# Patient Record
Sex: Male | Born: 1960 | Race: White | Hispanic: No | Marital: Married | Smoking: Current every day smoker
Health system: Southern US, Community
[De-identification: ages and names within clinical notes are randomized; demographics above are authoritative.]

## PROBLEM LIST (undated history)

## (undated) DIAGNOSIS — E1165 Type 2 diabetes mellitus with hyperglycemia: Secondary | ICD-10-CM

## (undated) DIAGNOSIS — E785 Hyperlipidemia, unspecified: Secondary | ICD-10-CM

## (undated) DIAGNOSIS — J45909 Unspecified asthma, uncomplicated: Secondary | ICD-10-CM

## (undated) DIAGNOSIS — T7840XA Allergy, unspecified, initial encounter: Secondary | ICD-10-CM

## (undated) DIAGNOSIS — R7303 Prediabetes: Secondary | ICD-10-CM

## (undated) HISTORY — DX: Unspecified asthma, uncomplicated: J45.909

## (undated) HISTORY — DX: Hyperlipidemia, unspecified: E78.5

## (undated) HISTORY — PX: NO PAST SURGERIES: SHX2092

## (undated) HISTORY — DX: Prediabetes: R73.03

## (undated) HISTORY — DX: Type 2 diabetes mellitus with hyperglycemia: E11.65

## (undated) HISTORY — DX: Allergy, unspecified, initial encounter: T78.40XA

## (undated) HISTORY — PX: OTHER SURGICAL HISTORY: SHX169

---

## 2008-09-14 DIAGNOSIS — J309 Allergic rhinitis, unspecified: Secondary | ICD-10-CM | POA: Insufficient documentation

## 2009-04-02 DIAGNOSIS — H60399 Other infective otitis externa, unspecified ear: Secondary | ICD-10-CM | POA: Insufficient documentation

## 2009-04-02 DIAGNOSIS — H6690 Otitis media, unspecified, unspecified ear: Secondary | ICD-10-CM | POA: Insufficient documentation

## 2011-06-23 ENCOUNTER — Telehealth: Payer: Self-pay | Admitting: Primary Care

## 2011-06-23 NOTE — Telephone Encounter (Signed)
Patient will be in tomorrow at 8 AM.

## 2011-06-23 NOTE — Telephone Encounter (Signed)
A week ago the pt slipped down his cellar stairs and hurt his L knee. He wanted to come in tomorrow to be seen by you, we dont have any PAs available and there are no openings on your schedule. Please advise

## 2011-06-23 NOTE — Telephone Encounter (Signed)
 I can see him at 8:00 am.

## 2011-06-24 ENCOUNTER — Ambulatory Visit: Payer: Self-pay | Admitting: Primary Care

## 2011-06-24 ENCOUNTER — Encounter: Payer: Self-pay | Admitting: Primary Care

## 2011-06-24 VITALS — BP 142/80 | HR 72 | Ht 70.87 in | Wt 255.0 lb

## 2011-06-24 DIAGNOSIS — M25562 Pain in left knee: Secondary | ICD-10-CM

## 2011-06-24 NOTE — Progress Notes (Signed)
PATIENT ID:  Luis Reyes is a 50 y.o. male who is here for left knee pain.    HPI:  One week ago he was carrying a basket of laundry up the stairs when he slipped and landed on a step on his anterior knee.  He experienced immediate pain which has not subsided.  Pain is diffuse throughout the knee and radiates to the distal thigh.  There was some swelling initially which seems to have gone down.  He has pain with weightbearing.  He has not experienced locking.  He has not injured this knee in the past.  Aleve it is somewhat helpful with the pain but does not completely relieve it.  Pain level 7/10.    Medications reviewed and no changes were made.    Current Outpatient Prescriptions   Medication Sig Dispense Refill   . fluticasone (FLONASE) 50 MCG/ACT nasal spray USE 2 SPRAYS IN EACH NOSTRIL ONCE DAILY  1  5     Allergies were reviewed and confirmed with the patient.    Allergies   Allergen Reactions   . No Known Drug Allergy        OBJECTIVE:    Filed Vitals:    06/24/11 0810   BP: 142/80   Pulse: 72   Height: 1.8 m (5' 10.87")   Weight: 115.667 kg (255 lb)    Body mass index is 35.70 kg/(m^2).    Physical Exam   Constitutional: Distressed: having trouble walking.   HENT:   Head: Normocephalic and atraumatic.   Eyes: Conjunctivae are normal. No scleral icterus.   Musculoskeletal:        Left knee: He exhibits decreased range of motion. He exhibits no ecchymosis, no deformity, no erythema and normal alignment. Tenderness: diffuse.   Neurological: He is alert. No cranial nerve deficit. Coordination normal. Abnormal gait: antalgic.   Psychiatric: Affect and judgment normal.     ASSESSMENT/PLAN:  1 week of left knee pain following impact injury.  I am concerned about and internal derangement, and fracture is also a possibility.  Will refer to orthopedics for further evaluation.    Follow-up with me as needed for this problem.  ]

## 2011-06-24 NOTE — Patient Instructions (Signed)
[facility]  [facility]  [address]  [place], [place], [address]    [phone]

## 2011-10-20 ENCOUNTER — Ambulatory Visit: Payer: Self-pay | Admitting: Primary Care

## 2011-10-20 ENCOUNTER — Encounter: Payer: Self-pay | Admitting: Gastroenterology

## 2011-10-20 ENCOUNTER — Encounter: Payer: Self-pay | Admitting: Primary Care

## 2011-10-20 VITALS — BP 118/78 | HR 78 | Ht 70.0 in | Wt 265.8 lb

## 2011-10-20 DIAGNOSIS — R079 Chest pain, unspecified: Secondary | ICD-10-CM

## 2011-10-20 DIAGNOSIS — F17200 Nicotine dependence, unspecified, uncomplicated: Secondary | ICD-10-CM

## 2011-10-20 MED ORDER — VARENICLINE TARTRATE 0.5 MG X 11 & 1 MG X 42 PO MISC *A*
ORAL | Status: DC
Start: 2011-10-20 — End: 2012-07-24

## 2011-10-20 NOTE — Patient Instructions (Addendum)
Ibuprofen.  Two to three 200 mg tablets three times a day, with food.  You can take up to 12 tablets/day, or 2,400 mg/day.  Stop if you develop stomach pain.  Do not take for more than 2 weeks.  Smoking Cessation     This document explains the best ways for you to quit smoking as well as new treatments to help. It lists new medications that can double or triple your chances of quitting and quitting for good. It also tells about ways to avoid relapses and talks about concerns you may have about quitting, including weight gain.      NICOTINE: A POWERFUL ADDICTION  If you have tried to quit smoking, you know how hard it can be. It is hard because nicotine is a very addictive drug. For some people, it can be as addictive as heroin or cocaine. Quitting is hard. Usually people make 2 or 3 tries, or more, before finally being able to quit. Each time you try to quit, you can learn about what helps and what hurts. Quitting takes hard work and a lot of effort, but you can quit smoking.     QUITTING SMOKING IS ONE OF THE MOST IMPORTANT THINGS YOU WILL EVER DO:   You will live longer and live better.   Quitting will lower your chance of having a heart attack, stroke, or cancer.   If you are pregnant, quitting smoking will improve your chances of having a healthy baby.   The people you live with, especially your children, will be healthier.   You will have extra money to spend on things other than cigarettes.     FIVE KEYS FOR QUITTING  Studies have shown that these five steps will help you quit and quit for good. You have the best chances of quitting if you use them together:   1 .Get ready.  2 .Get support.  3 .Learn new skills and behaviors.  4 .Get medication and use it correctly.  5 .Be prepared for relapse or difficult situations.     1. GET READY   Set a quit date.   Change your environment.  l Get rid of ALL cigarettes and ashtrays in your home, car, and place of work.  l Do not let people smoke in your home.    Review your past attempts to quit. Think about what worked and what did not.   Once you quit, do not smoke, NOT EVEN A PUFF!     2. GET SUPPORT AND ENCOURAGEMENT  Studies have shown that you have a better chance of being successful if you have help. You can get support in many ways:   Tell your family, friends, and coworkers that you are going to quit and need their support. Ask them not to smoke around you.   Talk to your health care provider (for example, doctor, dentist, nurse, pharmacist, psychologist, or smoking counselor).   Get individual, group or telephone counseling. The more counseling you have, the better your chances are of quitting. Programs are available at [employer] and health centers. Call your local health department for information about programs in your area.     3. LEARN NEW SKILLS AND BEHAVIORS   Try to distract yourself from urges to smoke. Talk to someone, go for a walk, or occupy your time with a task.   When you first try to quit, change your routine; use a different route to work. Drink tea instead of coffee. Eat breakfast in  a different place.   Do something to reduce your stress. Take a hot bath, exercise or read a book.   Plan something enjoyable to do every day.   Drink a lot of water and other fluids.     4. GET MEDICATION AND USE IT CORRECTLY  Medications can help you stop smoking and lessen the urge to smoke.    The U.S. Food and Drug Administration (FDA) has approved five medications to help you quit smoking:  l Bupropion SR - Available by prescription.  l Nicotine gum - Available over-the-counter.  l Nicotine inhaler - Available by prescription.  l Nicotine nasal spray - Available by prescription.  l Nicotine patch - Available by prescription and over-the-counter.   Ask your health care provider for advice and carefully read the information on the package.   All of these medications will more or less double your chances of quitting and quitting for good.    Everyone who is trying to quit may benefit from using a medication. If you are pregnant or trying to become pregnant, nursing, under age 62, smoking fewer than 10 cigarettes per day, or have a medical condition, talk to your doctor or other health care provider before taking medications.     5. BE PREPARED FOR RELAPSE OR DIFFICULT SITUATIONS  Most relapses occur within the first 3 months after quitting. Do not be discouraged if you start smoking again. Remember, most people try several times before they finally quit. Here are some difficult situations to watch for:    Alcohol. Avoid drinking alcohol. Drinking lowers your chances of success.    Other smokers. Being around smoking can make you want to smoke.    Weight gain. Many smokers will gain weight when they quit, usually less than 10 pounds. Eat a healthy diet and stay active. Do not let weight gain distract you from your main goal, quitting smoking. Some quit-smoking medications may help delay weight gain.    Bad mood or depression. There are a lot of ways to improve your mood other than smoking.  If you are having problems with any of these situations, talk to your doctor or other health care provider.     SPECIAL SITUATIONS OR CONDITIONS  Studies suggest that everyone can quit smoking. Your situation or condition can give you a special reason to quit.   Pregnant women/new mothers: By quitting, you protect your baby's health and your own.   Hospitalized patients: By quitting, you reduce health problems and help healing.   Heart attack patients: By quitting, you reduce your risk of a second heart attack.   Lung, head, and neck cancer patients: By quitting, you reduce your chance of a second cancer.    Parents of children and adolescents: By quitting, you protect your children and adolescents from illnesses caused by second-hand smoke.     QUESTIONS TO THINK ABOUT  Think about the following questions before you try to stop smoking. You may want to talk  about your answers with your health care provider.     1. Why do you want to quit?      2. If you tried to quit in the past, what helped and what did not?      3. What will be the most difficult situations for you after you quit? How will you plan to handle them?     4. Who can help you through the tough times? Your family? Friends? Health care provider?  5. What pleasures do you get from smoking? What ways can you still get pleasure if you quit?     Here are some questions to ask your health care provider.     1. How can you help me to be successful at quitting?      2. What medication do you think would be best for me and how should I take it?     3. What should I do if I need more help?      4. What is smoking withdrawal like? How can I get information on withdrawal?     QUITTING TAKES HARD WORK AND A LOT OF EFFORT, BUT YOU CAN QUIT SMOKING.  Additional Resources  You may want to contact these organizations for further information on smoking and how to quit.     American Heart Association   [address]  [place], [place] [address]  [phone] [id number] [phone]) American Lung Association   [address]  [place], [place] [address]  [phone]   American Cancer Society  [address], [place], [place] [address]  [phone] [facility]  [place], [place] [address]  [phone] 4-CANCER [phone])         For pregnant women: American College of Obstetricians and Gynecologists  [address]  [place], [place] [address]     FOR MORE INFORMATION  The information was taken from Treating Tobacco Use and Dependence, a U.S. Public Health Service-sponsored Clinical Practice Guideline. This guideline was developed by a non-Federal panel of experts sponsored by a consortium consisting of [employer] and nonprofit organizations:    Hotel manager).   Centers for Disease Control and Prevention (CDC).   [employer] (NCI).   National Heart, Lung, and Blood  Institute (NHLBI).   [employer] on Drug Abuse (NIDA).    [employer] [facility]).   [employer] for Tobacco Research and Intervention (CTRI).     For information about the guideline or to get more copies of this information, call: [phone], or write:   Public[employer], [address], [place] [address]  U.S. Department of Health and [employer], Public Health Service     Document Released: 08/02/2001  Document Re-Released: 11/04/2008  [facility] Patient Information 2010 [place], [place].    Smoking Cessation,Ten Tips for Success     1 .Throw away all cigarettes.  2 .Clean and remove all ashtrays from your home, work, and car.  3 .On a card, write down your reasons for quitting. Carry the card with you and read it when you get the urge to smoke.    4 .Cleanse your body of nicotine. Drink 6 to 8 glasses of water a day for the first week after quitting to flush the nicotine from your body.  5 .Change habits associated with smoking. Stand up when drinking your coffee, brush your teeth after eating, sit in a different chair when you read the paper. Avoid alcohol while trying to quit.  6 .Avoid foods and drinks that can trigger a desire to smoke, such as sugary or spicy foods and alcohol.  7 .Use oral substitutes such as lemon drops, carrots, a cinnamon stick, or chewing gum in place of cigarettes.  8 .When you have the urge to smoke, try deep breathing.  9 .If you are a heavy smoker, ask your caregiver about a prescription for Nicorette chewing gum. It can ease your withdrawal  from nicotine.  1 0.Reward yourself. Set aside the cigarette money you save and buy yourself something nice.     LIFE AS A NON-SMOKER  Day 1 Hang this page where you will see it everyday.  Day 2 Get rid of all ashtrays.  Day 3 Drink lots of water. Breathe deeply between sips.  Day 4 Avoid places with smoke filled air.  Day 5 Keep track of how much money you save by not  smoking.  Day 6 Avoid boredom. Keep a good book with you or go to the movies.  Day 7 Reward yourself!!! One week without smoking!  Day 8 Make a dental appointment to get your teeth cleaned.  Day 9 Decide how you will turn down a cigarette before it is offered to you.  Day 10 Review your reasons for quitting.  Day 11 Distract yourself. Do a crossword puzzle.  Day 12  Exercise. Get off the bus before your stop or use stairs instead of escalators.  Day 13 Call on friends for support and encouragement.  Day 14 Reward yourself!!! Two weeks without smoking!   Day 15  Practice deep-breathing exercises.  Day 16 Bet a friend you can stay a non-smoker.  Day 17 Ask to sit in non-smoking sections of restaurants.  Day 18 Hang up "No Smoking" signs.  Day 19 Think of yourself as a non-smoker.  Day 20 Each morning tell yourself you will not smoke.  Day 21 Reward yourself!!! Three weeks without smoking!   Day 22 Think of smoking in negative ways. Remember how it stains your teeth, fouls your   breath, and shortens your breath.   Day 23 Eat a nutritious breakfast.  Day 24 Do not relive your days as a non-smoker.  Day 25 Hold a pencil in your hand when talking on the telephone.  Day 26 Tell all your friends you do not smoke.  Day 27 Think about how much better food tastes.  Day 28 Remember, one cigarette is one too many.  Day 29 Take up a hobby that will keep your hands busy.  Day 30 Congratulations!! One month without smoking! Give yourself a big reward.     Your caregivers may be able to direct you to community or hospitals for support which may include:   Group Support   Education    Hypnosis   Subliminal Therapy     THINGS YOU SHOULD KNOW ABOUT SMOKING   If you were injected all at once with the nicotine in a pack of cigarettes, you would die. One cigarette eaten can kill a baby or young child.   More than 2,000 deaths of infants under one-year old are attributable to smoking by mothers.   Children who smoke are 15 times  more likely than non-smokers to go on to use narcotic drugs.   The cigarette is the single most important cause of fires. One third of home fire deaths result from smoking.   Cigarette smoking contains over 4,000 chemicals. More than 30 are known to cause cancer. Smokers retain in their lungs more than 70% of the tar and nicotine they inhale.     Document Released: 05/06/2004    [facility] Patient Information 2010 [place], [place].      Exercise stress echo:    Appointment: October 27, 2011 at 1:00 PM   [employer], 1st Floor  [phone]    Wear comfy clothes and sneakers

## 2011-10-20 NOTE — Progress Notes (Signed)
Subjective:      Luis Reyes is a 51 y.o. male who presents for evaluation of chest pain. Onset was 3 days ago. Inciting event: none known. Symptoms have worsened since that time. The patient describes the pain as pressure and does not radiate, in the anterior chest wall: bilaterally. Patient rates pain as a 7/10 in intensity. Associated symptoms are: none. He denies exertional chest pain, even when working out with weights yesterday, diaphoresis, nausea, abdominal pain and lightheadedness.  Aggravating factors are: none known, but he thinks it might be in part related to stress at work, where he is "under pressure to produce."  (He runs a delivery service for [employer] Alleviating factors are: none. Patient's cardiac risk factors are: male gender, obesity (BMI >= 30 kg/m2) and smoking/ tobacco exposure. Patient's risk factors for DVT/PE: none. Previous cardiac testing: none.    Marteze smokes 1.5 packs of cigarettes per day.  He wants to quit.  He did quit for 18 months once.  He used nicotine patches but they fell off of work when he sweated.    Review of Systems  Constitutional: negative for chills and fevers  Respiratory: negative for dyspnea on exertion  Musculoskeletal:positive for back pain and left knee pain  Neurological: negative for headaches  Behavioral/Psych: positive for anxiety and difficulty sleeping      Objective:     Body mass index is 38.14 kg/(m^2).    Physical Exam   Constitutional: He is well-developed, well-nourished, and in no distress.   HENT:   Head: Normocephalic and atraumatic.   Eyes: Conjunctivae are normal. No scleral icterus.   Cardiovascular: Normal rate, regular rhythm and normal heart sounds.    No murmur heard.  Pulmonary/Chest: Effort normal. Decreased breath sounds: throughout. He exhibits no tenderness.   Neurological: He is alert. No cranial nerve deficit. Gait normal. Coordination normal.   Psychiatric: Affect and judgment normal.       Cardiographics  ECG: sinus  bradycardia, rate=52 and inferior T-wave inversion, no change from  Jan 12, 2009     Assessment:      Chest pain, suspected etiology: chest wall pain and possible component of esophageal reflux, possible component of stress      Plan:      #1.  Chest pain.  Patient history and exam consistent with non-cardiac cause of chest pain.  Conservative measures indicated.  OTC analgesics as needed.  Worsening signs and symptoms discussed and patient verbalized understanding.  Follow up with me in 1 month.  Ordered stress echo given his risk factors    2.  Nicotine dependence.  Advised patient to quit.  He wants to quit.  Reviewed the importance of quitting for him and the potential rewards (improved health, saving money, living longer, feeling better, performing better in physical activities). Reviewed available cessation aids.  He will try Chantix.    Follow-up in 1 month(s), sooner if needed.    Greater than 50% of this 25 min visit was spent on education and counseling regarding the patient's chest pain and nicotine dependence (list conditions addressed) as documented in my assessment and plan.Marland Kitchen

## 2011-10-27 ENCOUNTER — Other Ambulatory Visit: Payer: Self-pay | Admitting: Primary Care

## 2011-10-27 ENCOUNTER — Ambulatory Visit: Payer: Self-pay

## 2011-10-27 ENCOUNTER — Telehealth: Payer: Self-pay | Admitting: Primary Care

## 2011-10-27 NOTE — Telephone Encounter (Signed)
Please tell him the stress echo showed no signs of heart disease.

## 2011-10-27 NOTE — Progress Notes (Signed)
Here for stress echo.

## 2011-10-28 NOTE — Telephone Encounter (Signed)
Patient notified of the results  

## 2011-11-17 ENCOUNTER — Ambulatory Visit: Payer: Self-pay | Admitting: Primary Care

## 2011-11-17 ENCOUNTER — Encounter: Payer: Self-pay | Admitting: Primary Care

## 2011-11-17 VITALS — BP 130/78 | HR 76 | Ht 70.0 in | Wt 262.0 lb

## 2011-11-17 DIAGNOSIS — E669 Obesity, unspecified: Secondary | ICD-10-CM

## 2011-11-17 DIAGNOSIS — F17200 Nicotine dependence, unspecified, uncomplicated: Secondary | ICD-10-CM

## 2011-11-17 LAB — HM HIV SCREENING OFFERED

## 2011-11-17 NOTE — Progress Notes (Signed)
Subjective:       Luis Reyes is a 51 y.o. male here for followup of his chest pain, discussion regarding weight loss in smoking cessation.    #1.  Chest pain.  This has resolved.  He had a negative stress echo.    2.  Obesity. He has noted a weight gain of approximately 40 pounds over the last 1 year. He feels ideal weight is 227-230 pounds. Weight at graduation from high school was over 200 pounds. There is a family history positive for obesity in the patient and aunt. Previous treatments for obesity include OTC appetite suppressants: Hydroxycut, was associated with restlessness and insomnia. Obesity associated medical conditions: osteoarthritis. Obesity associated medications: none. Cardiovascular risk factors besides obesity: male gender.  He says, "My diet is terrible."  He has recently started exercising at a gym with a personal trainer.  He asks if bariatric surgery would be appropriate for him.    #3.  Nicotine dependence.  He has reduced his smoking to 2 cigarettes per day.   He is taking Chantix.  He does note increased appetite has he has reduced his smoking.    Danye  has no past medical history on file.  Jahlon has Allergic Rhinitis and Nicotine dependence on his problem list.  Carlan  has past surgical history that includes Urologic surgery.    Current Outpatient Prescriptions on File Prior to Visit   Medication Sig Dispense Refill   . varenicline (CHANTIX STARTING PAK) 0.5 MG X 11 & 1 MG X 42 tablet pak Take one 0.5mg  tablet daily for 3 days, then one 0.5mg  tablet twice daily for 4 days, then one 1mg  tablet twice daily.  53 tablet  0   . fluticasone (FLONASE) 50 MCG/ACT nasal spray USE 2 SPRAYS IN EACH NOSTRIL ONCE DAILY  1  5         Objective:      Body mass index is 37.59 kg/(m^2).  He has lost 3 pounds since her last visit.    BP 130/78  Pulse 76  Ht 1.778 m (5\' 10" )  Wt 118.842 kg (262 lb)  BMI 37.59 kg/m2  General appearance: alert, appears stated age, cooperative and no distress  Head:  Normocephalic, without obvious abnormality, atraumatic  Neurologic: Grossly normal      Assessment:      Obesity. I assessed Ludwik to be in an action stage with respect to weight loss.      Plan:      1.  Obesity.  General weight loss/lifestyle modification strategies discussed (elicit support from others; identify saboteurs; non-food rewards, etc).  Behavioral treatment: will refer to Care Manager, Royetta Crochet, RN, Care Manager for [facility].  Diet interventions: moderate (500 kCal/d) deficit diet.  Informal exercise measures discussed, e.g. taking stairs instead of elevator.  Regular aerobic exercise program discussed.    2.  Nicotine dependence.  Praised him for cutting back and encouraged him to quit completely.  Continue Chantix.    Follow up in: 3 months and as needed.

## 2012-02-14 ENCOUNTER — Ambulatory Visit: Payer: Self-pay | Admitting: Primary Care

## 2012-07-24 ENCOUNTER — Ambulatory Visit: Payer: Self-pay | Admitting: Primary Care

## 2012-07-24 ENCOUNTER — Encounter: Payer: Self-pay | Admitting: Primary Care

## 2012-07-24 VITALS — BP 138/80 | HR 88 | Ht 70.0 in | Wt 262.0 lb

## 2012-07-24 DIAGNOSIS — M25562 Pain in left knee: Secondary | ICD-10-CM

## 2012-07-24 DIAGNOSIS — F17200 Nicotine dependence, unspecified, uncomplicated: Secondary | ICD-10-CM

## 2012-07-24 MED ORDER — MELOXICAM 15 MG PO TABS *I*
15.0000 mg | ORAL_TABLET | Freq: Every day | ORAL | Status: DC
Start: 2012-07-24 — End: 2013-02-08

## 2012-07-24 NOTE — Progress Notes (Signed)
Patient ID: Luis Reyes is a 51 y.o. year old male who presents today for left knee pain.    SUBJECTIVE     Luis Reyes has a history of left knee pain.  He was seen at [facility] last year and told that he has a "cracked knee cap" and arthritis.  He has been getting by on Advil and Aleve for the past year, but, as of 4 days ago, his pain has been much more severe, 9/10.  His knee is swollen.  He has been severly limited in his actvities, especially stairs.  There has been no specific injury.  He Related to the colder weather.  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    Allergy / Social History / Medications:     Allergies   Allergen Reactions   . No Known Drug Allergy      History   Substance Use Topics   . Smoking status: Current Every Day Smoker   . Smokeless tobacco: Never Used   . Alcohol Use: Not on file     Medications reviewed and changes made as per A/P.    No current outpatient prescriptions on file.     No current facility-administered medications for this visit.       OBJECTIVE     BP 138/80  Pulse 88  Ht 1.778 m (5\' 10" )  Wt 118.842 kg (262 lb)  BMI 37.59 kg/m2      CONSTITUTIONAL: The patient is well-developed, well-nourished, and in some distress from left knee pain.   HEAD: Normocephalic and atraumatic.   EYES: Conjunctivae are normal. No scleral icterus.   MUSCULOSKELETAL:  Prefers not to bend the left knee.  The knee is swollen.  There is generalized tenderness, greater on the medial aspect.  There is no erythema or warmth.   NEUROLOGICAL: Alert. No cranial nerve deficit. Coordination normal.  Antalgic gait  PSYCHIATRIC: Affect and judgment normal.       Recent Lab Results     No results found for this basename: NA, K, CL, CALCIUM, PHOS, CO2, UN, CREAT, VID25, WBC, HGB, HCT, PLT, TSH, HGBA1C, CHOL, TRIG, HDL, LDLC, CHHDC         ASSESSMENT / DIAGNOSIS     1. Left knee pain, suspected flare of arthritis    - meloxicam (MOBIC) 15 MG tablet; Take 1 tablet (15 mg total)  by mouth daily   Take with food.  Dispense: 15 tablet; Refill: 0  - AMB REFERRAL TO ORTHOPEDIC SURGERY, might benefit from a cortisone shot; I called and patient has an appointment tomorrow with Melany Guernsey at [facility]    2. Nicotine dependence:  [facility] Smoking Cessation Plan    Discussed smoking cessation with patient. Patient readiness to quit: No, but "thinking about it"   Discussed/counseled with patient smoking cessation plan according to USPSTF guidelines: I advised patient to quit, and offered support.  Agricultural engineer distributed.  Discussed current use pattern.  Asked patient to inform me when they set a quit date.      ORDERS AND PLAN     Orders Placed This Encounter   . AMB REFERRAL TO ORTHOPEDIC SURGERY   . meloxicam (MOBIC) 15 MG tablet     --Patient instructed to call if symptoms are not improving or worsening    --Follow up as needed    Signed: Greggory Keen, MD

## 2012-07-25 ENCOUNTER — Ambulatory Visit: Payer: Self-pay | Admitting: Orthopedic Surgery

## 2012-09-04 ENCOUNTER — Ambulatory Visit: Payer: Self-pay | Admitting: Orthopedic Surgery

## 2012-09-04 ENCOUNTER — Encounter: Payer: Self-pay | Admitting: Orthopedic Surgery

## 2012-09-04 VITALS — BP 130/82 | HR 73 | Ht 72.0 in | Wt 240.0 lb

## 2012-09-04 DIAGNOSIS — R52 Pain, unspecified: Secondary | ICD-10-CM

## 2012-09-04 DIAGNOSIS — M2390 Unspecified internal derangement of unspecified knee: Secondary | ICD-10-CM

## 2012-09-04 NOTE — Patient Instructions (Signed)
Injection Instructions    · The injection you received today may take 5-7 days to work.  · You may find the area that was injected to be more painful for 1 or 2 days after the injection.  This happens as the medicine is being absorbed in your body.  · It may be helpful to put ice on the body part that was injected to ease the pain.  · You may also use pain medication - Tylenol, Advil/Motrin or Aleve.  · Remember, it may take 5-7 days for you to feel the full results of the injection.  · If you are diabetic the medicine in the injection can increase your blood sugar level for several days.  Monitor your glucose levels closely.

## 2012-09-04 NOTE — Progress Notes (Signed)
CC:  Left knee pain    HPI:  Luis Reyes comes the office today for evaluation of his left knee.  His new patient to the practice.  He injured this knee in December 2012 fell down some stairs.  He was originally treated by [facility] for this.  He tells me he think he broke his kneecap.  He did not wear a brace.  He was supposed to followup with them and never did.  The knee seemed to get better with time however this winter the pain has returned.  The knee feels stiff.  He feels that it swollen.  He denies any locking.  He does feel as if the knees any give way.  He feels like he has to frequently change positions to keep the comfortable.  He does not complain of any locking or catching sensations.  The knee has not given way and caused him to fall.  He's been using Aleve or Mobic as needed for the pain.  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  By the end of the day his knee is quite painful.  He does not feel that he has decreased range of motion.  He denies any start up pain.  He denies any hip or groin pain.  He denies any calf pain or distal paresthesias.    No past medical history on file.  Past Surgical History   Procedure Laterality Date   . Urologic surgery        Social History     Occupational History   . Not on file.     Social History Main Topics   . Smoking status: Current Every Day Smoker   . Smokeless tobacco: Never Used   . Alcohol Use: Not on file   . Drug Use: Not on file   . Sexually Active: Not on file     Current Outpatient Prescriptions on File Prior to Visit   Medication Sig Dispense Refill   . meloxicam (MOBIC) 15 MG tablet Take 1 tablet (15 mg total) by mouth daily   Take with food.  15 tablet  0     No current facility-administered medications on file prior to visit.      Family History   Problem Relation Age of Onset   . Conversion Other      [id number] Health Status^^Active^MO b 1941, DM, Asthma, RA; FA b 1942, DM, premature CVD,  CABG, valve replacement, DM   . Conversion Other      [id number] Health Status Child 1 Daughter^^Active^b 2000   . Conversion Other      [id number] Health Status Siblings ___ Born^^Active^3 BRO, one has renal failure, one has AIDS; 3 SIS, one has proteinuria   . Heart defect Father      valve       A 10 system ROS was reviewed with the patient. Per HPI otherwise noncontributory. This is documented on the [facility] new patient registration questionaire.    Physical exam:   on exam, Luis Reyes is a well-dressed well-nourished 52 year old male in no acute distress.  He is alert and oriented x3.  He is pleasant and cooperative.  He stands 6 foot tall and weighs 240 pounds.  His BMI is 32.5.    On examination of his left knee skin is intact there is no erythema edema or ecchymosis.  There is a trace effusion of the joint.  There is tenderness to palpation  over the medial joint line.  There is tenderness to palpation of the patella.  There is pain with patellar grind.  There is no tenderness over the quadriceps or patellar tendons.  There is no lateral joint line tenderness.  Through small Baker's cyst.  Range of motion of the knee is from full extension to 100 of flexion.  There is anterior knee pain with this.  He stable varus valgus stress testing.  Lachman's is negative.  McMurray's does create some discomfort throughout the joint.  I cannot appreciate any frank meniscal click.  His calf is soft supple and nontender.  CMS intact distally.    Brief exam of his left hip shows range of motion is well-preserved without any discomfort.    Imaging:   x-rays of the patient's left knee were obtained in the office today.  They were independently interpreted by myself.  I did review with the patient.  The left knee is without significant arthritic change.  There is mild joint space narrowing of the medial compartment.  There is mild spurring of the tibial spines.  There is slight decreased joint space the  patellofemoral articulation.  There is some spurring of the kneecap. I cannot appreciate any evidence of acute fracture dislocation.      Assessment:   1.  Right knee pain due to a fall on the right knee and possible flareups underlying arthritis.    Plan:   diagnosis and treatment options were discussed in detail with the patient.  A cortisone injection was offered and accepted.  Postinjection activities and instructions were discussed with the patient.  It also like him to start some formal physical therapy.  A prescription was brushed for this.  We'll see him back in 8 weeks.  If these had no relief from therapy and cortisone injection we can consider an MRI of the knee.  He may possibly have some internal derangement.  Questions were obtained and answered.  I encouraged him to call any time if problems or sooner.    Procedure Note:  After verbal consent, a time out was taken to verify correct procedure site.  The left knee was prepped in the usual sterile fashion.  40 mg of Depo-Medrol along with 8 cc of 1% plain lidocaine was injected into the joint.  Patient tolerated the procedure well.  Band-Aid was applied.  Post injection instructions were reviewed with the patient.     This note has been dictated using Animal nutritionist. Reasonable attempts to correct typing mistakes have been done. Please excuse any inherent inaccuracy that might have occurred.

## 2012-10-30 ENCOUNTER — Ambulatory Visit: Payer: Self-pay | Admitting: Orthopedic Surgery

## 2013-02-08 ENCOUNTER — Encounter: Payer: Self-pay | Admitting: Primary Care

## 2013-02-08 ENCOUNTER — Ambulatory Visit: Payer: Self-pay | Admitting: Primary Care

## 2013-02-08 VITALS — BP 120/80 | HR 62 | Ht 72.0 in | Wt 240.0 lb

## 2013-02-08 DIAGNOSIS — Z139 Encounter for screening, unspecified: Secondary | ICD-10-CM

## 2013-02-08 DIAGNOSIS — M79602 Pain in left arm: Secondary | ICD-10-CM

## 2013-02-08 DIAGNOSIS — M79601 Pain in right arm: Secondary | ICD-10-CM | POA: Insufficient documentation

## 2013-02-08 NOTE — Progress Notes (Signed)
Patient ID: Luis Reyes is a 52 y.o. year old male who presents today for evaluation of bilateral arm pain.    SUBJECTIVE     For 2 weeks he has had pain occurring in either arm, located from below the shoulders to just past the elbows and does radiate across his chest.  Lying on his R side causes pain in the L arm and vice versa.  He denies numbness, tingling and weakness.  His neck doesn't hurt.  He has taken Aleve with some relief.      Review of Systems   Constitutional: Negative for chills.        He has felt warm and sweaty at times   HENT: Negative for neck pain.    Musculoskeletal: Back pain: lower back.       Allergy / Social History / Medications:     Allergies   Allergen Reactions   . Chantix (Varenicline) Nausea And Vomiting   . No Known Drug Allergy      History   Substance Use Topics   . Smoking status: Current Every Day Smoker   . Smokeless tobacco: Never Used   . Alcohol Use: Not on file     Medications reviewed and no changes made.    Current Outpatient Prescriptions   Medication Sig   . Naproxen Sodium (ALEVE PO) Take 500 mg by mouth daily as needed     No current facility-administered medications for this visit.       OBJECTIVE     BP 120/80  Pulse 62  Ht 1.829 m (6')  Wt 108.863 kg (240 lb)  BMI 32.54 kg/m2  SpO2 95%    Weight decreased 22 pounds since last visit with me in December, 2013.  He has been eating "less ice cream sandwiches" and has been more active.    CONSTITUTIONAL: The patient is well-developed, well-nourished, and in no acute distress.   HEAD: Normocephalic and atraumatic.   EYES: Conjunctivae are normal. No scleral icterus.   NECK:  Reduced range of extension, painless.  When he flexes his neck he feels pain in his upper chest.  HEART:  Regular  MUSCULOSKELETAL:  Full range of motion of both shoulders, painless.  Bilateral radial pulses 2+.    NEUROLOGICAL: Alert. No cranial nerve deficit.  Biceps, triceps and brachioradialis reflexes 1+ bilaterally.  Bilateral grip  strength 5/5.  Coordination normal.  Gait normal  PSYCHIATRIC: Affect and judgment normal.       Recent Lab Results     No results found for this basename: NA, K, CL, CALCIUM, PHOS, CO2, UN, CREAT, VID25, WBC, HGB, HCT, PLT, TSH, HGBA1C, CHOL, TRIG, HDL, LDLC, CHHDC         ASSESSMENT / PLAN     Bilateral arm pain, positional.  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  He does have weak reflexes in his arms but they are equal.  He has had weight loss and describes subjectively feeling warm, will screen for infectious causes.  - check cervical x-ray  - check CBC and ESR  - consider referral for PT-- he says he would prefer seeing his personal trainer, whom he says is a physical therapist, but would like to see what the x-ray shows first  - If physical therapy doesn't help and/or diagnosis remains unclear, would refer to a neurologist for help with diagnosis.      ORDERS     Orders Placed This Encounter   . Spine cervical complete 6  or more views   . Basic metabolic panel   . Lipid panel   . PSA ([id number])   . CBC and differential   . Sedimentation rate, automated     --Patient instructed to call if symptoms are not improving or worsening    --Follow-up in 1 month(s), sooner if needed    Signed: Greggory Keen, MD

## 2013-04-01 ENCOUNTER — Ambulatory Visit: Payer: Self-pay | Admitting: Primary Care

## 2013-05-21 ENCOUNTER — Ambulatory Visit: Payer: Self-pay | Admitting: Primary Care

## 2013-05-21 ENCOUNTER — Encounter: Payer: Self-pay | Admitting: Primary Care

## 2013-05-21 VITALS — BP 118/82 | HR 72 | Temp 98.2°F | Resp 18 | Ht 72.0 in | Wt 278.0 lb

## 2013-05-21 DIAGNOSIS — R059 Cough, unspecified: Secondary | ICD-10-CM

## 2013-05-21 DIAGNOSIS — F17200 Nicotine dependence, unspecified, uncomplicated: Secondary | ICD-10-CM

## 2013-05-21 MED ORDER — GUAIFENESIN 600 MG PO TB12 *I*
1200.0000 mg | ORAL_TABLET | Freq: Two times a day (BID) | ORAL | Status: AC
Start: 2013-05-21 — End: 2013-05-31

## 2013-05-21 MED ORDER — ALBUTEROL SULFATE HFA 108 (90 BASE) MCG/ACT IN AERS *I*
2.0000 | INHALATION_SPRAY | RESPIRATORY_TRACT | Status: AC | PRN
Start: 2013-05-21 — End: ?

## 2013-05-21 NOTE — Student Note (Signed)
Subjective:    CC: painful cough  HPI:   Luis Reyes is a 52 y.o. male here for evaluation of a cough.  The cough is non-productive, productive of clear sputum and is aggravated by nothing. Onset of symptoms was 1 day ago, gradually worsening since that time. Patient reports that he woke up with a feeling of dryness and a "cotton mouth" and then the cough started 1-2 hours later and was painful immediately.  Associated symptoms include chills and sputum production as well as sneezing. He was able to sleep last night after taking "a shot of Niquil and two shots of Luis Reyes" and did not wake up due to coughing. Patient does have a history of asthma. Patient has had recent travel to [place] over the summer, but has had no recent exposure to sick people. Patient does have a history of smoking, but has not smoked in the last few days. Patient  has not had a previous chest x-ray. Patient has not had a PPD done.  Patient's medications, allergies, past medical, surgical, social and family histories were reviewed and updated as appropriate.    2. Arm pain--from last time, patient treats with Alleve which works well enough.  3. Smoking Cessation--Patient reports smoking 1/2-3/4 pack/day currently and has smoked for 26 years except for a period of 18 months a few years ago when he quit "cold [place]" when he started going to the gym a lot and had a Systems analyst who made him do the stair-stepper for 20 minutes whenever he smelled like smoke. He started smoking when his dog, Luis Reyes (black lab/[employer]) had to be put down. Since then he's been smoking. He has not done well with Chantix (it made him sick), or the patch (falls off).     Review of Systems  Respiratory: negative for wheezing         Objective:   BP 118/82  Pulse 72  Temp(Src) 36.8 C (98.2 F) (Oral)  Resp 18  Ht 1.829 m (6')  Wt 126.1 kg (278 lb)  BMI 37.7 kg/m2  SpO2 97%    General appearance: Obese middle aged man who appears  tired, appears his age, and is responsive and communicative.   Skin: Red, blanching, splotchy patch rostral to sternum. No other obvious skin changes.   HEENT: Nose is clear with pink mucosa, oral pharynx is pink with some erythema on posterior wall and no exudate.   Neck is soft and nontender with no appreciable adenopathy.  Cardiac: Capillary refill is <2 second and S1 S2 are clear with normal rate and rhythm and no extra heart sounds.   Lungs: Clear to auscultation bilaterally with deep breaths. Deep breathing does not elicit pain. Wet cough appreciated.     Assessment:   Patient is a 52 yo man with a history significant for smoking and asthma who presents with a one day history of cough with sneezing and no fever.    Acute Bronchitis  Due to the absence of fever, adenopathy, or exudate, viral bronchitis is more likely than bacterial.      Plan:      Explained lack of efficacy of antibiotics in viral disease.  Antitussives per medication orders.  Avoid exposure to tobacco smoke and fumes.  B-agonist inhaler.  Call if shortness of breath worsens, blood in sputum, change in character of cough, development of fever or chills, inability to maintain nutrition and hydration. Avoid exposure to tobacco smoke and fumes.  2. Arm pain--Patient to follow-up if the Aleve stops working.   3. Smoking: Patient is not ready to talk about smoking cessation today. He knows that it's bad for him and has even stopped while he's sick, but he's resistant to considering quitting. Patient advised that quitting would help his health and that being sick is a good time to try.   Dellie Burns

## 2013-05-21 NOTE — Progress Notes (Signed)
Subjective:       Luis Reyes is a 52 y.o. male here for evaluation of a cough. Onset of symptoms was 1 day ago. Symptoms have been gradually worsening since that time. The cough is nonproductive but he feels like there is phlegm which he can't clear from his chest.  Associated symptoms include: chills and sneezing. Patient does have a history of childhood asthma. Patient does have a history of environmental allergens.  Patient does have a history of smoking, currently 1/2 to 3/4 ppd, not planning to quit in the near future. Patient has not had a previous chest x-ray. Patient has not had a PPD done.    Patient's medications, allergies, past medical, surgical, social and family histories were reviewed and updated as appropriate.    Review of Systems   HENT:        No sinus pain       Objective:     BP 118/82  Pulse 72  Temp(Src) 36.8 C (98.2 F) (Oral)  Resp 18  Ht 1.829 m (6')  Wt 126.1 kg (278 lb)  BMI 37.7 kg/m2  SpO2 97%    Physical Exam   Constitutional: He is well-developed, well-nourished, and in no distress.   HENT:   Head: Normocephalic and atraumatic.   Mouth/Throat: Uvula is midline. No oropharyngeal exudate or posterior oropharyngeal erythema.   Some wax in canals, but visible parts of TM(s) not erythematous.  Pink nasal mucosa.     Eyes: Conjunctivae are normal. No scleral icterus.   Cardiovascular: Normal rate, regular rhythm and normal heart sounds.    Pulmonary/Chest: He has wheezes (faint, clear with cough) in the left upper field. He has no rales.   Lymphadenopathy:     He has no cervical adenopathy.   Neurological: He is alert. No cranial nerve deficit. Gait normal. Coordination normal.   Psychiatric: Affect and judgment normal.      Assessment:     Bronchitis, likely viral, one day of symptoms, smoker.    Plan:      Explained lack of efficacy of antibiotics in viral disease.  Antitussives per medication orders.  Avoid exposure to tobacco smoke and fumes.  B-agonist inhaler.  Call if  shortness of breath worsens, blood in sputum, change in character of cough, development of fever or chills, inability to maintain nutrition and hydration. Avoid exposure to tobacco smoke and fumes.     [facility] Smoking Cessation Plan  Discussed smoking cessation with patient. Patient readiness to quit: No    Discussed/counseled with patient smoking cessation plan according to USPSTF guidelines: I advised patient to quit, and offered support.  Discussed current use pattern.  Asked patient to inform me when they set a quit date.    RTO prn    Greggory Keen, MD

## 2013-05-22 ENCOUNTER — Telehealth: Payer: Self-pay | Admitting: Primary Care

## 2013-05-22 NOTE — Telephone Encounter (Signed)
Message relayed to patient. He appreciates the help.

## 2013-05-22 NOTE — Telephone Encounter (Signed)
He told me he is looking for a new doctor for his father.  Please tell him I recommend Dr. Karle Starch at [facility], [address] [place], [address], [place], [address], phone [phone].

## 2014-04-03 MED ORDER — PREDNISONE 10 MG TABLETS IN A DOSE PACK
10 mg | ORAL_TABLET | ORAL | Status: DC
Start: 2014-04-03 — End: 2014-04-09

## 2014-04-03 NOTE — Progress Notes (Signed)
Today's Date:  04/03/2014   Patient:  Joshua Mcgrath  Patient DOB:  [id number]    Chief Complaint   Patient presents with   ??? Establish Care   ??? Ankle Pain       Joshua Mcgrath  Is a 53 y.o. Caucasian male  who presents for initial visit. Pt presented for initial visit. Pt relating an acute onset of left ankle pain and swelling. No h/o trauma, or overexertion, or similar episode. Pt has not tried any treatment. States it is slightly better than at onset. Pt relating moving from [place] about one year ago. Also looking to establish care.      Past Medical History:  History reviewed. No pertinent past medical history.    Past Surgical History:  History reviewed. No pertinent past surgical history.    Social history:   History     Social History   ??? Marital Status: SINGLE     Spouse Name: N/A     Number of Children: N/A   ??? Years of Education: N/A     Social History Main Topics   ??? Smoking status: Current Every Day Smoker   ??? Smokeless tobacco: Never Used   ??? Alcohol Use: 0.0 oz/week     0 Not specified per week      Comment: rarely   ??? Drug Use: No   ??? Sexual Activity: Not on file     Other Topics Concern   ??? Not on file     Social History Narrative   ??? No narrative on file       Medications:  Current outpatient prescriptions: diphenhydrAMINE (BENADRYL) 25 mg capsule, Take 25 mg by mouth every six (6) hours as needed., Disp: , Rfl:     Allergies:  Allergies not on file    Past Family History:   Family History   Problem Relation Age of Onset   ??? Diabetes Mother    ??? Diabetes Father    ??? Heart Disease Father    ??? Arthritis-osteo Father    ??? Cancer Father      skin cancer       REVIEW OF SYSTEMS:   Constitutional ??? weight gain, no night sweats, unexplained fevers  Ophtho ??? no vision change or eye pain  Ears ??? no hearing loss, ear pain, fullness.   Cardiac ??? no CP, PND, orthopnea, edema, palpitations or syncope  Chest ??? no breast masses  Resp ??? no wheezing, chronic coughing, dyspnea   GI ??? no heartburn, nausea, vomiting, change in bowel habits, bleeding, hemorrhoids  Urinary ??? no dysuria, hematuria, flank pain, urgency, frequency  Psych ??? denies any anxiety or depression symptoms, no hallucinations, suicidal, or violent ideation  Neuro ??? no focal weakness, numbness, paresthesias, incoordination, ataxia, involuntary movements  Endo - no polyuria, polydipsia, nocturia ,positive persistent fatigue.    PHYSICAL EXAM:  Visit Vitals   Item Reading   ??? BP 135/78 mmHg   ??? Pulse 75   ??? Temp(Src) 98.5 ??F (36.9 ??C) (Oral)   ??? Resp 18   ??? Ht 6' (1.829 m)   ??? Wt 276 lb 3.2 oz (125.283 kg)   ??? BMI 37.45 kg/m2   ??? SpO2 96%     -- Weight:  Body mass index is 37.45 kg/(m^2).   Pt alert and oriented x 3.  Affect is appropriate.  Mood stable. No apparent distress  HEENT -- PERRL, EOMI, conjunctiva and lids normal, Anicteric sclerae.  Neck--Supple, trachea midline. No adenopathy,  thyromegaly, JVD, or bruits.    Lungs --Clear to auscultation.  Heart --Regular rate and rhythm, no murmurs, rubs, gallops, or clicks.  Extremities -- Without cyanosis, clubbing, edema. Normal looking digits,  Mus/Skel--  Marked tenderness, with some thickening of left proximal achilles tendon, no erythema, or increased warmth, FORM at ankle.  No results found for this or any previous visit.        Assessment/Plan & Orders:     Joshua Mcgrath was seen today for establish care and ankle pain.    Diagnoses and associated orders for this visit:    Tendonitis, Achilles, left    Healthcare maintenance  - METABOLIC PANEL, COMPREHENSIVE  - LIPID PANEL  - URINALYSIS W/MICROSCOPIC  - PSA SCREENING (SCREENING) (G0103)    Weight gain, abnormal  - TSH, 3RD GENERATION  - TESTOSTERONE, FREE & TOTAL    Fatigue  - CBC WITH AUTOMATED DIFF  - TSH, 3RD GENERATION  - TESTOSTERONE, FREE & TOTAL    Family history of ASCVD    Other Orders  - predniSONE (STERAPRED DS) 10 mg dose pack; See administration instruction per 10mg  dose pack          Follow-up Disposition:   Return in about 1 week (around 04/10/2014) for CPE.   Reviewed with patient the treatment plan, goals of treatment plan, and limitations of treatment plan, to include the potential for side effects from medications and procedures. If side effects occur, it is the responsibility of the patient to inform the clinic so that a change in the treatment plan can be made in a safe manner. The patient is advised that stopping prescribed medication may cause an increase in symptoms and possible medication withdrawal symptoms. The patient is informed an emergency room evaluation may be necessary if this occurs.      Patient verbalized understanding and is in agreement with treatment plan as outlined above.  All questions answered.        Jerolyn Shin MD  [facility]  Ph - [phone]  Fax - [phone]

## 2014-04-03 NOTE — Patient Instructions (Signed)
Fatigue: After Your Visit  Your Care Instructions  Fatigue is a feeling of tiredness, exhaustion, or lack of energy. You may feel fatigue because of too much or not enough activity. It can also come from stress, lack of sleep, boredom, and poor diet. Many medical problems, such as viral infections, can cause fatigue. Emotional problems, especially depression, are often the cause of fatigue.  Fatigue is most often a symptom of another problem. Treatment for fatigue depends on the cause. For example, if you have fatigue because you have a certain health problem, treating this problem also treats your fatigue. If depression or anxiety is the cause, treatment may help.  Follow-up care is a key part of your treatment and safety. Be sure to make and go to all appointments, and call your doctor if you are having problems. It's also a good idea to know your test results and keep a list of the medicines you take.  How can you care for yourself at home?  1. Get regular exercise. But don't overdo it. Go back and forth between rest and exercise.  2. Get plenty of rest.  3. Eat a healthy diet. Do not skip meals, especially breakfast.  4. Reduce your use of caffeine, tobacco, and alcohol. Caffeine is most often found in coffee, tea, cola drinks, and chocolate.  5. Limit medicines that can cause fatigue. This includes tranquilizers and cold and allergy medicines.  When should you call for help?  Watch closely for changes in your health, and be sure to contact your doctor if:  1. You have new symptoms such as fever or a rash.  2. Your fatigue gets worse.  3. You have been feeling down, depressed, or hopeless. Or you may have lost interest in things that you usually enjoy.  4. You are not getting better as expected.   Where can you learn more?   Go to [facility]  Enter [phone] in the search box to learn more about "Fatigue: After Your Visit."    ?? 2006-2015 [employer]. Care instructions adapted under license by [employer] (which disclaims liability or warranty for this information). This care instruction is for use with your licensed healthcare professional. If you have questions about a medical condition or this instruction, always ask your healthcare professional. [employer], Inc[employer] disclaims any warranty or liability for your use of this information.  Content Version: 10.5.422740; Current as of: July 05, 2013              Abnormal Weight Gain: After Your Visit  Your Care Instructions  There are two types of weight gain???normal and abnormal. Normal weight gain is usually caused by eating too much or exercising too little. It can also happen as you get older.  But abnormal weight gain has other causes. It can be caused by a problem with your thyroid gland, called hypothyroidism. Or it can be caused by a problem with your adrenal glands, called Cushing's syndrome. Or your body could be holding too much fluid because of kidney, liver, or heart problems. In some cases, a medicine you take can cause you to gain weight.  You can work with your doctor to find out the cause of your weight gain. You will probably need tests to do this.  Follow-up care is a key part of your treatment and safety. Be sure to make and go to all appointments, and call your doctor if you are having problems. It's also a good idea to know your test results and  keep a list of the medicines you take.  How can you care for yourself at home?  6. Weigh yourself at the same time every day. It's best to do it first thing in the morning after you empty your bladder. Be sure to always wear the same amount of clothing.  7. Write down any changes in your weight and the possible causes. Discuss these with your doctor.  8. Your doctor may want you to change your diet and exercise habits. A good way to lose weight is to reduce calories and increase exercise.   9. Walking is an easy way to get exercise. Try to walk a little longer every day. You also may want to swim, bike, or do other activities. Try to get 60 to 90 minutes of activity a day to lose weight and keep it off.  10. Ask your doctor if you should see a dietitian. This is a person who can help you plan meals that work best for your lifestyle.  11. If your doctor prescribed medicines, take them exactly as prescribed. Call your doctor if you think you are having a problem with your medicine. You will get more details on the specific medicines your doctor prescribes.  When should you call for help?  Watch closely for changes in your health, and be sure to contact your doctor if:  5. You do not get better as expected.  6. You continue to gain weight.   Where can you learn more?   Go to [facility]  Enter A175 in the search box to learn more about "Abnormal Weight Gain: After Your Visit."   ?? 2006-2015 [employer]. Care instructions adapted under license by [employer] (which disclaims liability or warranty for this information). This care instruction is for use with your licensed healthcare professional. If you have questions about a medical condition or this instruction, always ask your healthcare professional. [employer], [employer] disclaims any warranty or liability for your use of this information.  Content Version: 10.5.422740; Current as of: October 11, 2013              Achilles Tendon: Exercises  Your Care Instructions  Here are some examples of exercises for your achilles tendon. Start each exercise slowly. Ease off the exercise if you start to have pain.  Your doctor or physical therapist will tell you when you can start these exercises and which ones will work best for you.  How to do the exercises  Toe stretch    12. Sit in a chair, and extend your affected leg so that your heel is on the floor.   13. With your hand, reach down and pull your big toe up and back. Pull toward your ankle and away from the floor.  14. Hold the position for at least 15 to 30 seconds.  15. Repeat 2 to 4 times a session, several times a day.  Calf-plantar fascia stretch    7. Sit with your legs extended and knees straight.  8. Place an elastic band or towel around your foot just under the toes. A towel will give you a more effective stretch.  9. Hold each end of the towel or band in each hand, with your hands above your knees.  10. Pull back with the towel or band so that your foot stretches toward you.  11. Hold the position for at least 15 to 30 seconds.  12. Repeat 2 to 4 times a session, up to 5 sessions a day.  Floor stretch    1. Stand about 2 feet from a wall, and place your hands on the wall at about shoulder height. Or you can stand behind a chair, placing your hands on the back of it for balance.  2. Step back with the leg you want to stretch. Keep the leg straight, and press your heel into the floor with your toe turned slightly in.  3. Lean forward, and bend your other leg slightly. Feel the stretch in the Achilles tendon of your back leg. Hold for at least 15 to 30 seconds.  4. Repeat 2 to 4 times a session, up to 5 sessions a day.  Stair stretch    1. Stand with the balls of both feet on the edge of a step or curb (or a medium-sized phone book). With at least one hand, hold onto something solid for balance, such as a banister or handrail.  2. Keeping your affected leg straight, slowly let that heel hang down off of the step or curb until you feel a stretch in the back of your calf and/or Achilles area. Some of your weight should still be on the other leg.  3. Hold this position for at least 15 to 30 seconds.  4. Repeat 2 to 4 times a session, up to 5 times a day or whenever your Achilles tendon starts to feel tight. This stretch can also be done with your knee slightly bent.   Follow-up care is a key part of your treatment and safety. Be sure to make and go to all appointments, and call your doctor if you are having problems. It's also a good idea to know your test results and keep a list of the medicines you take.   Where can you learn more?   Go to [facility]  Enter [phone] in the search box to learn more about "Achilles Tendon: Exercises."   ?? 2006-2015 [employer]. Care instructions adapted under license by [employer] (which disclaims liability or warranty for this information). This care instruction is for use with your licensed healthcare professional. If you have questions about a medical condition or this instruction, always ask your healthcare professional. [employer] disclaims any warranty or liability for your use of this information.  Content Version: 10.5.422740; Current as of: July 05, 2013

## 2014-04-03 NOTE — Progress Notes (Signed)
Advance Care Planning:   Patient was offered the opportunity to discuss advance care planning YES   Does patient have an Advance Directive:  NO   If no, did you provide information on [facility]?  YES

## 2014-04-09 NOTE — Progress Notes (Signed)
Today's Date:  04/09/2014   Patient:  Joshua Mcgrath  Patient DOB:  08/31/1960    Subjective:     Chief Complaint   Patient presents with   ??? Well Male       Joshua Mcgrath  Is a 53 y.o. Caucasian male  who presents for a complete physical. Pt states his ankle is markedly improved simply with stretching. No new complaints. He relates he is preparing to start an exercise program.     Past Medical History:  History reviewed. No pertinent past medical history.    Past Surgical History:  History reviewed. No pertinent past surgical history.    Social history:  History     Social History   ??? Marital Status: SINGLE     Spouse Name: N/A     Number of Children: N/A   ??? Years of Education: N/A     Social History Main Topics   ??? Smoking status: Current Every Day Smoker   ??? Smokeless tobacco: Never Used   ??? Alcohol Use: 0.0 oz/week     0 Not specified per week      Comment: rarely   ??? Drug Use: No   ??? Sexual Activity: Not on file     Other Topics Concern   ??? Not on file     Social History Narrative       Medications:  Current Outpatient Prescriptions on File Prior to Visit   Medication Sig Dispense Refill   ??? diphenhydrAMINE (BENADRYL) 25 mg capsule Take 25 mg by mouth every six (6) hours as needed.     ??? predniSONE (STERAPRED DS) 10 mg dose pack See administration instruction per 10mg  dose pack 21 Tab 0     No current facility-administered medications on file prior to visit.       Allergies:  No Known Allergies    Past Family History:   Family History   Problem Relation Age of Onset   ??? Diabetes Mother    ??? Diabetes Father    ??? Heart Disease Father    ??? Arthritis-osteo Father    ??? Cancer Father      skin cancer       Immunizations reviewed, none indicated.    Subjective:  Health Maintenance History colonoscopy:being scheduled. Eye exam: within last 6 months     REVIEW OF SYSTEMS:   Ophtho ??? no vision change or eye pain  Oral ??? no mouth pain, tongue or tooth problems   Ears ??? no hearing loss, ear pain, fullness, no swallowing problems  Cardiac ??? no CP, PND, orthopnea, edema, palpitations or syncope  Resp ??? no wheezing, chronic coughing, dyspnea  GI ??? no heartburn, nausea, vomiting, change in bowel habits, bleeding, hemorrhoids  Urinary ??? no dysuria, hematuria, flank pain, urgency, frequency  Genitals ??? no genital lesions, discharge, masses, ulceration, warts, or tenderness.  Ortho ??? no swelling, dec ROM, myalgias  Derm ??? no nail abnormalities, rashes, lesions of note, hair loss  Psych ??? denies any anxiety or depression symptoms, no hallucinations, suicidal, or violent ideation  Constitutional ??? no wt loss, night sweats, unexplained fevers  Neuro ??? no focal weakness, numbness, paresthesias, incoordination, ataxia, involuntary movements  Endo - no polyuria, polydipsia, nocturia.    PHYSICAL EXAM:  Visit Vitals   Item Reading   ??? BP 116/70 mmHg   ??? Pulse 58   ??? Temp(Src) 98.2 ??F (36.8 ??C) (Oral)   ??? Resp 18   ??? Ht 6' (1.829 m)   ???  Wt 275 lb 12.8 oz (125.102 kg)   ??? BMI 37.40 kg/m2   ??? SpO2 95%     Pt alert and oriented x 3.  Affect is appropriate.  Mood stable. No apparent distress  HEENT -- PERRL, EOMI, conjunctiva and lids normal, Anicteric sclerae  Disks were sharp, ear canals normal. tympanic membranes normal, Sinuses were nontender, turbinates normal, hearing normal.  Oropharynx  without erythema, lesions, normal tongue, dentitia, oral mucosa and tonsils.  Neck--Supple, trachea midline. No adenopathy, thyromegaly, JVD, or bruits.    Lungs --Clear to auscultation and percussion, normal percussion.  Heart --Regular rate and rhythm, no murmurs, rubs, gallops, or clicks.  Chest wall --Nontender to palpation.  PMI normal.  Abdomen -- Soft, obese, nontender, no hepatosplenomegaly or masses, bowel sounds present in all quadrants.  GU  -- normal penis, no scrotal masses, testicular tenderness or hernias  Prostate  -- no asymmetry, nodularity, tenderness or enlargement   Rectal  -- normal tone, guiaiac negative brown stool  Extremities -- Without cyanosis, clubbing, edema. 2+ pulses equally and bilaterally. Normal looking digits, ROM.  Mus/Skel--Normal bulk, and tone for age.  Lymphatic--No abnormal adenopathy noted.  Neuro -- CN 2-12 grossly intact, strength 5/5 with intact pinprick in all extremities, cerebellar exam WNL, downgoing toes, 1+DTRs, neg Romberg.      Derm-- no obvious abnormalities noted, no rash, skin warm, moist, and dry.  Psych--appropriate mood, affect, thought. Memory intact.    Assessment:       1. Family history of ASCVD        Plan:       Current Outpatient Prescriptions   Medication Sig Dispense Refill   ??? diphenhydrAMINE (BENADRYL) 25 mg capsule Take 25 mg by mouth every six (6) hours as needed.        Joshua Mcgrath was seen today for well male.    Diagnoses and associated orders for this visit:    Obesity (BMI 30-39.9)  Comments: Pt meeting with personal trainer today.    Family history of ASCVD  Comments: Labs pending. Pt to return for baseline ECG.  - AMB POC EKG ROUTINE W/ 12 LEADS, INTER & REP    Healthcare maintenance        Follow-up Disposition:  Return in about 4 months (around 08/09/2014) for pending lab results.     Reviewed with patient the treatment plan, goals of treatment plan, and limitations of treatment plan.    Patient verbalized understanding and is in agreement with treatment plan as outlined above.  All questions answered.

## 2014-04-09 NOTE — Progress Notes (Signed)
1. Have you been to the ER, urgent care clinic since your last visit?  Hospitalized since your last visit?No    2. Have you seen or consulted any other health care providers outside of the [facility] since your last visit?  Include any pap smears or colon screening. No

## 2014-04-10 LAB — CBC WITH AUTOMATED DIFF
ABS. BASOPHILS: 0 10*3/uL (ref 0.0–0.2)
ABS. EOSINOPHILS: 0.1 10*3/uL (ref 0.0–0.4)
ABS. IMM. GRANS.: 0 10*3/uL (ref 0.0–0.1)
ABS. MONOCYTES: 0.6 10*3/uL (ref 0.1–0.9)
ABS. NEUTROPHILS: 7 10*3/uL (ref 1.4–7.0)
Abs Lymphocytes: 2.1 10*3/uL (ref 0.7–3.1)
BASOPHILS: 0 %
EOSINOPHILS: 1 %
HCT: 49.7 % (ref 37.5–51.0)
HGB: 16.2 g/dL (ref 12.6–17.7)
IMMATURE GRANULOCYTES: 0 %
Lymphocytes: 21 %
MCH: 29.5 pg (ref 26.6–33.0)
MCHC: 32.6 g/dL (ref 31.5–35.7)
MCV: 91 fL (ref 79–97)
MONOCYTES: 6 %
NEUTROPHILS: 72 %
PLATELET: 281 10*3/uL (ref 150–379)
RBC: 5.49 x10E6/uL (ref 4.14–5.80)
RDW: 13.7 % (ref 12.3–15.4)
WBC: 9.8 10*3/uL (ref 3.4–10.8)

## 2014-04-10 LAB — METABOLIC PANEL, COMPREHENSIVE
A-G Ratio: 1.6 (ref 1.1–2.5)
ALT (SGPT): 54 IU/L — ABNORMAL HIGH (ref 0–44)
AST (SGOT): 36 IU/L (ref 0–40)
Albumin: 4.2 g/dL (ref 3.5–5.5)
Alk. phosphatase: 42 IU/L (ref 39–117)
BUN/Creatinine ratio: 18 (ref 9–20)
BUN: 17 mg/dL (ref 6–24)
Bilirubin, total: 0.6 mg/dL (ref 0.0–1.2)
CO2: 25 mmol/L (ref 18–29)
Calcium: 9.3 mg/dL (ref 8.7–10.2)
Chloride: 100 mmol/L (ref 97–108)
Creatinine: 0.96 mg/dL (ref 0.76–1.27)
GFR est AA: 105 mL/min/{1.73_m2} (ref >59)
GFR est non-AA: 91 mL/min/{1.73_m2} (ref >59)
GLOBULIN, TOTAL: 2.7 g/dL (ref 1.5–4.5)
Glucose: 136 mg/dL — ABNORMAL HIGH (ref 65–99)
Potassium: 4.6 mmol/L (ref 3.5–5.2)
Protein, total: 6.9 g/dL (ref 6.0–8.5)
Sodium: 140 mmol/L (ref 134–144)

## 2014-04-10 LAB — MICROSCOPIC EXAMINATION
Bacteria: NONE SEEN
Epithelial cells: NONE SEEN /hpf (ref 0–10)

## 2014-04-10 LAB — LIPID PANEL
Cholesterol, total: 116 mg/dL (ref 100–199)
HDL Cholesterol: 37 mg/dL — ABNORMAL LOW (ref >39)
LDL, calculated: 58 mg/dL (ref 0–99)
Triglyceride: 104 mg/dL (ref 0–149)
VLDL, calculated: 21 mg/dL (ref 5–40)

## 2014-04-10 LAB — URINALYSIS W/MICROSCOPIC
Bilirubin: NEGATIVE
Blood: NEGATIVE
Glucose: NEGATIVE
Ketone: NEGATIVE
Leukocyte Esterase: NEGATIVE
Nitrites: NEGATIVE
Protein: NEGATIVE
Specific Gravity: 1.028 (ref 1.005–1.030)
Urobilinogen: 0.2 mg/dL (ref 0.0–1.9)
pH (UA): 6 (ref 5.0–7.5)

## 2014-04-10 LAB — TSH 3RD GENERATION: TSH: 1.19 u[IU]/mL (ref 0.450–4.500)

## 2014-04-10 LAB — TESTOSTERONE, FREE & TOTAL
Free testosterone (Direct): 11.7 pg/mL (ref 7.2–24.0)
Testosterone: 437 ng/dL (ref 348–1197)

## 2014-04-10 LAB — PSA SCREENING (SCREENING): Prostate Specific Ag: 0.6 ng/mL (ref 0.0–4.0)

## 2014-04-11 NOTE — Progress Notes (Signed)
Quick Note:        Recommend increasing exercise, and adding OTC Niacin 500 mg daily to diet, in an attempt to raise slightly low HDL level, otherwise labs good.    ______

## 2014-04-14 NOTE — Telephone Encounter (Signed)
-----   Message from Daryll Drown, MD sent at 04/11/2014  8:31 AM EDT -----  Recommend increasing exercise, and adding OTC Niacin 500 mg daily to diet, in an attempt to raise slightly low HDL level, otherwise labs good.

## 2014-04-14 NOTE — Telephone Encounter (Signed)
Informed pt of labs. Pt verbalized understanding.

## 2014-04-16 NOTE — Progress Notes (Signed)
Patient came in for EKG due to lack of supplies to perform the EKG last visit.

## 2016-05-03 ENCOUNTER — Telehealth: Payer: Self-pay | Admitting: Primary Care

## 2016-05-03 NOTE — Telephone Encounter (Signed)
Letter mailed to pt to make appt with dr. Cox

## 2016-07-08 NOTE — Telephone Encounter (Signed)
Letter mailed to pt to make appt

## 2016-10-10 DIAGNOSIS — L03012 Cellulitis of left finger: Secondary | ICD-10-CM | POA: Diagnosis not present

## 2017-05-03 DIAGNOSIS — S139XXA Sprain of joints and ligaments of unspecified parts of neck, initial encounter: Secondary | ICD-10-CM | POA: Diagnosis not present

## 2017-09-12 ENCOUNTER — Ambulatory Visit (INDEPENDENT_AMBULATORY_CARE_PROVIDER_SITE_OTHER): Payer: 59

## 2017-09-12 ENCOUNTER — Ambulatory Visit: Payer: 59 | Admitting: Family Medicine

## 2017-09-12 ENCOUNTER — Encounter: Payer: Self-pay | Admitting: Family Medicine

## 2017-09-12 VITALS — BP 142/84 | HR 77 | Temp 98.3°F | Ht 72.0 in | Wt 262.4 lb

## 2017-09-12 DIAGNOSIS — M25562 Pain in left knee: Secondary | ICD-10-CM | POA: Diagnosis not present

## 2017-09-12 DIAGNOSIS — F17200 Nicotine dependence, unspecified, uncomplicated: Secondary | ICD-10-CM | POA: Diagnosis not present

## 2017-09-12 DIAGNOSIS — R030 Elevated blood-pressure reading, without diagnosis of hypertension: Secondary | ICD-10-CM

## 2017-09-12 DIAGNOSIS — M179 Osteoarthritis of knee, unspecified: Secondary | ICD-10-CM | POA: Diagnosis not present

## 2017-09-12 MED ORDER — NICOTINE 21 MG/24HR TD PT24: 21.0000 mg | MEDICATED_PATCH | Freq: Every day | TRANSDERMAL | 1 refills | Status: DC

## 2017-09-12 MED ORDER — DICLOFENAC SODIUM 75 MG PO TBEC: 75.0000 mg | DELAYED_RELEASE_TABLET | Freq: Two times a day (BID) | ORAL | 0 refills | Status: DC

## 2017-09-12 NOTE — Patient Instructions (Addendum)
You have arthritis in your knees. Please start the voltaren twice daily for the next 2 weeks. Use compression as tolerated. You can also use ice to the area 3-4 times a day for 10-15 minutes at a time.   We can try a cortisone injection in the future if your symptoms worsen or do not improve.   Use the nicotine patch for 6 weeks. We can decrease the dose after that if you are doing well. Please call 1-800-QUIT-NOW for additional resources.   Please come back in 2-3 weeks for your full physical.  Take care, Dr Jimmey RalphParker

## 2017-09-12 NOTE — Progress Notes (Addendum)
Subjective:  Steven Brooks is a 57 y.o. male who presents today with a chief complaint of left knee pain and to establish care.   HPI:  Works as a Psychiatristshipping supervisor. [place]   Left Knee Pain, New Problem Symptoms started about 2 weeks ago.  Worsened over that time.  Located along the inner aspect of his left knee.  No obvious precipitating events.  No trauma.  No history of knee injury.  Also with some swelling.  Try taking ibuprofen which helps a little bit.  No fevers or chills.  No locking, popping or catching.  Symptoms worse when going from standing to sitting.  No other obvious alleviating or aggravating factors.  Nicotine Dependence, New Problem Patient has smoked about a pack per day for the last 30 years.  He was able to quit for a couple years in the past however went through a depressive episode and started back.  He has tried Chantix in the past which he did not tolerate due to GI side effects.  Is interested in starting nicotine replacement therapy.  ROS: Per HPI, otherwise a 10 point review of systems was performed and was negative  PMH:  The following were reviewed and entered/updated in epic: History reviewed. No pertinent past medical history. Patient Active Problem List   Diagnosis Date Noted  . Left knee pain 09/12/2017  . Nicotine dependence with current use 09/12/2017   History reviewed. No pertinent surgical history.  Family history of congestive heart failure, diabetes, and cancer in his mother.  Medications- reviewed and updated Current Outpatient Medications  Medication Sig Dispense Refill  . ibuprofen (ADVIL,MOTRIN) 200 MG tablet Take 600 mg by mouth at bedtime.    . diclofenac (VOLTAREN) 75 MG EC tablet Take 1 tablet (75 mg total) by mouth 2 (two) times daily. 30 tablet 0  . nicotine (NICODERM CQ - DOSED IN MG/24 HOURS) 21 mg/24hr patch Place 1 patch (21 mg total) onto the skin daily. 21 patch  1   No current facility-administered medications for this visit.    Allergies-reviewed and updated No Known Allergies  Social History   Socioeconomic History  . Marital status: None    Spouse name: None  . Number of children: 3  . Years of education: None  . Highest education level: None  Social Needs  . Financial resource strain: None  . Food insecurity - worry: None  . Food insecurity - inability: None  . Transportation needs - medical: None  . Transportation needs - non-medical: None  Occupational History  . None  Tobacco Use  . Smoking status: Current Every Day Smoker  . Smokeless tobacco: Never Used  Substance and Sexual Activity  . Alcohol use: Yes    Frequency: Never    Comment: Very Rarely  . Drug use: No  . Sexual activity: None  Other Topics Concern  . None  Social History Narrative  . None   Objective:  Physical Exam: BP (!) 142/84 (BP Location: Left Arm, Patient Position: Sitting, Cuff Size: Normal)   Pulse 77   Temp 98.3 F (36.8 C) (Oral)   Ht 6' (1.829 m)   Wt 262 lb 6.4 oz (119 kg)   SpO2 94%   BMI 35.59 kg/m   Gen: NAD, resting comfortably CV: RRR with no murmurs appreciated Pulm: NWOB, CTAB with no crackles, wheezes, or rhonchi GI: Normal bowel sounds present. Soft, Nontender, Nondistended. MSK:  -Left  knee: Mild effusion noted.  Crepitus with passive range of motion.  Tender to palpation along medial joint line.  Stable to varus and valgus stress.  Anterior and posterior drawer signs negative.  Lockman negative.  Thessaly negative. -Right knee: No deformities.  Full range of motion.  Slight crepitus with passive range of motion.  Nontender to palpation. Skin: Warm, dry Neuro: Grossly normal, moves all extremities Psych: Normal affect and thought content  Assessment/Plan:  Nicotine dependence with current use Patient was asked about his tobacco use today and was strongly advised to quit. Patient is currently contemplative. We reviewed  treatment options to assist him quit smoking including NRT, Chantix, and Bupropion.  He would like to start nicotine replacement therapy.  We will send in a prescription for nicotine patches.  Follow up at next office visit.  Total time spent counseling approximately 5 minutes.    Left knee pain Most likely secondary to osteoarthritis.  His x-rays have some degenerative changes-we will await radiology read for final result.  Discussed treatment options with patient.  We will proceed with conservative management today.  Will start Voltaren 75 mg twice daily for the next 2 weeks.  Also advised use of ice to the area 3-4 times daily for 10-15 minutes at a time.  Also recommended compression sleeve to the area.  Offered steroid injection however patient deferred.  He will follow-up with me in 2-3 weeks.  If no improvement or symptoms worsen, would consider steroid injection at that time.  Preventative healthcare Patient will return soon for comprehensive physical exam.  Due for colon cancer screening and screening blood work.  Elevated blood pressure reading Mildly elevated today in setting of acute pain.  He will follow-up with me in a few weeks we can recheck at that time.  No medication start pharmacological therapy at this point.  Katina Degree. Jimmey Ralph, MD 09/12/2017 11:28 AM

## 2017-09-12 NOTE — Assessment & Plan Note (Signed)
Most likely secondary to osteoarthritis.  His x-rays have some degenerative changes-we will await radiology read for final result.  Discussed treatment options with patient.  We will proceed with conservative management today.  Will start Voltaren 75 mg twice daily for the next 2 weeks.  Also advised use of ice to the area 3-4 times daily for 10-15 minutes at a time.  Also recommended compression sleeve to the area.  Offered steroid injection however patient deferred.  He will follow-up with me in 2-3 weeks.  If no improvement or symptoms worsen, would consider steroid injection at that time.

## 2017-09-12 NOTE — Assessment & Plan Note (Signed)
Patient was asked about his tobacco use today and was strongly advised to quit. Patient is currently contemplative. We reviewed treatment options to assist him quit smoking including NRT, Chantix, and Bupropion.  He would like to start nicotine replacement therapy.  We will send in a prescription for nicotine patches.  Follow up at next office visit.  Total time spent counseling approximately 5 minutes.

## 2017-09-26 ENCOUNTER — Encounter: Payer: Self-pay | Admitting: Family Medicine

## 2017-09-26 ENCOUNTER — Ambulatory Visit (INDEPENDENT_AMBULATORY_CARE_PROVIDER_SITE_OTHER): Payer: 59 | Admitting: Family Medicine

## 2017-09-26 VITALS — BP 134/82 | HR 71 | Temp 98.3°F | Ht 72.0 in | Wt 258.8 lb

## 2017-09-26 DIAGNOSIS — Z0000 Encounter for general adult medical examination without abnormal findings: Secondary | ICD-10-CM | POA: Diagnosis not present

## 2017-09-26 DIAGNOSIS — E669 Obesity, unspecified: Secondary | ICD-10-CM

## 2017-09-26 DIAGNOSIS — Z1159 Encounter for screening for other viral diseases: Secondary | ICD-10-CM

## 2017-09-26 DIAGNOSIS — R5383 Other fatigue: Secondary | ICD-10-CM | POA: Diagnosis not present

## 2017-09-26 DIAGNOSIS — Z114 Encounter for screening for human immunodeficiency virus [HIV]: Secondary | ICD-10-CM | POA: Diagnosis not present

## 2017-09-26 DIAGNOSIS — Z13220 Encounter for screening for lipoid disorders: Secondary | ICD-10-CM | POA: Diagnosis not present

## 2017-09-26 DIAGNOSIS — F17200 Nicotine dependence, unspecified, uncomplicated: Secondary | ICD-10-CM | POA: Diagnosis not present

## 2017-09-26 DIAGNOSIS — Z125 Encounter for screening for malignant neoplasm of prostate: Secondary | ICD-10-CM

## 2017-09-26 DIAGNOSIS — Z0001 Encounter for general adult medical examination with abnormal findings: Secondary | ICD-10-CM

## 2017-09-26 NOTE — Progress Notes (Signed)
Subjective:  Steven Brooks is a 57 y.o. male who presents today for his annual comprehensive physical exam.    HPI:  He has no acute complaints today.  He is feeling more fatigued over the last several weeks.  He has been cutting down on cigarettes. Using nicotine patches which seems to be helping.   Lifestyle Diet: No specific diets.  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   Depression screen PHQ 2/9 09/12/2017  Decreased Interest 0  Down, Depressed, Hopeless 0  PHQ - 2 Score 0   Health Maintenance Due  Topic Date Due  . Hepatitis C Screening  27-Dec-1960  . HIV Screening  12/04/1977  . TETANUS/TDAP  12/04/1981  . COLONOSCOPY  12/04/2012   ROS: Positive for fatigue, congestion, cough, shortness of breath, seasonal allergies, sad mood, and sleep disturbance, otherwise a 10 point review of systems was performed and was negative  PMH:  The following were reviewed and entered/updated in epic: Past Medical History:  Diagnosis Date  . Asthma    Patient Active Problem List   Diagnosis Date Noted  . Other fatigue 09/26/2017  . Left knee pain 09/12/2017  . Nicotine dependence with current use 09/12/2017   History reviewed. No pertinent surgical history.  Family History  Problem Relation Age of Onset  . Arthritis Mother   . Asthma Mother   . Diabetes Mother   . Heart attack Mother   . Heart disease Mother   . Hypertension Mother   . Arthritis Father   . Cancer Father   . Depression Father   . Diabetes Father   . Hearing loss Father   . Heart attack Father   . Heart disease Father   . Hypertension Father   . Hearing loss Brother   . Depression Brother   . Hearing loss Sister   . Hearing loss Sister   . Hearing loss Brother   . Kidney disease Brother   . Hearing loss Brother     Medications- reviewed and updated Current Outpatient Medications  Medication Sig Dispense Refill  . diclofenac (VOLTAREN) 75 MG EC tablet Take 1 tablet (75 mg  total) by mouth 2 (two) times daily. 30 tablet 0  . ibuprofen (ADVIL,MOTRIN) 200 MG tablet Take 600 mg by mouth at bedtime.    . nicotine (NICODERM CQ - DOSED IN MG/24 HOURS) 21 mg/24hr patch Place 1 patch (21 mg total) onto the skin daily. 21 patch 1   No current facility-administered medications for this visit.     Allergies-reviewed and updated No Known Allergies  Social History   Socioeconomic History  . Marital status: Single    Spouse name: None  . Number of children: 3  . Years of education: None  . Highest education level: None  Social Needs  . Financial resource strain: None  . Food insecurity - worry: None  . Food insecurity - inability: None  . Transportation needs - medical: None  . Transportation needs - non-medical: None  Occupational History  . None  Tobacco Use  . Smoking status: Current Every Day Smoker  . Smokeless tobacco: Never Used  Substance and Sexual Activity  . Alcohol use: Yes    Frequency: Never    Comment: Very Rarely  . Drug use: No  . Sexual activity: None  Other Topics Concern  . None  Social History Narrative  . None    Objective:  Physical Exam: BP 134/82 (BP Location: Left Arm, Patient Position: Sitting, Cuff  Size: Normal)   Pulse 71   Temp 98.3 F (36.8 C) (Oral)   Ht 6' (1.829 m)   Wt 258 lb 12.8 oz (117.4 kg)   SpO2 93%   BMI 35.10 kg/m   Body mass index is 35.1 kg/m. Wt Readings from Last 3 Encounters:  09/26/17 258 lb 12.8 oz (117.4 kg)  09/12/17 262 lb 6.4 oz (119 kg)   Gen: NAD, resting comfortably HEENT: TMs normal bilaterally. OP clear. No thyromegaly noted.  CV: RRR with no murmurs appreciated Pulm: NWOB, CTAB with no crackles, wheezes, or rhonchi GI: Normal bowel sounds present. Soft, Nontender, Nondistended. MSK: no edema, cyanosis, or clubbing noted Skin: warm, dry Neuro: CN2-12 grossly intact. Strength 5/5 in upper and lower extremities. Reflexes symmetric and intact bilaterally.  Psych: Normal affect  and thought content  Assessment/Plan:  Other fatigue Check CBC, CMET, TSH, vitamin B12.  May be secondary to viral respiratory infection.  If lab work negative and symptoms persist for another few weeks, advised patient to return for follow-up visit.  Nicotine dependence with current use Doing much better on nicotine replacement.  Encouraged patient to continue with cessation.  Preventative Healthcare: Check hep C antibody, HIV antibody, lipid panel, PSA.  Discussed colon cancer screening-patient deferred colonoscopy.  Given information with cologuard.  Patient Counseling:  -Nutrition: Stressed importance of moderation in sodium/caffeine intake, saturated fat and cholesterol, caloric balance, sufficient intake of fresh fruits, vegetables, and fiber.  -Stressed the importance of regular exercise.   -Substance Abuse: Discussed cessation/primary prevention of tobacco, alcohol, or other drug use; driving or other dangerous activities under the influence; availability of treatment for abuse.   -Injury prevention: Discussed safety belts, safety helmets, smoke detector, smoking near bedding or upholstery.   -Sexuality: Discussed sexually transmitted diseases, partner selection, use of condoms, avoidance of unintended pregnancy and contraceptive alternatives.   -Dental health: Discussed importance of regular tooth brushing, flossing, and dental visits.  -Health maintenance and immunizations reviewed. Please refer to Health maintenance section.  Return to care in 1 year for next preventative visit.   Katina Degreealeb M. Jimmey RalphParker, MD 09/26/2017 11:08 AM

## 2017-09-26 NOTE — Assessment & Plan Note (Signed)
Doing much better on nicotine replacement.  Encouraged patient to continue with cessation.

## 2017-09-26 NOTE — Assessment & Plan Note (Signed)
Check CBC, CMET, TSH, vitamin B12.  May be secondary to viral respiratory infection.  If lab work negative and symptoms persist for another few weeks, advised patient to return for follow-up visit.

## 2017-09-26 NOTE — Patient Instructions (Signed)

## 2017-09-28 ENCOUNTER — Other Ambulatory Visit: Payer: 59

## 2018-07-23 ENCOUNTER — Telehealth: Payer: Self-pay

## 2018-07-23 NOTE — Telephone Encounter (Signed)
He's welcome to switch to me, but just remind him we are not at the [facility] location. If he would prefer to see Dr. Drue NovelPaz, that is fine with me also. TY.

## 2018-07-23 NOTE — Telephone Encounter (Signed)
Does Dr. Drue NovelPaz take new patients?

## 2018-07-23 NOTE — Telephone Encounter (Signed)
Dr. Drue NovelPaz deferring to Dr. Carmelia RollerWendling.

## 2018-07-23 NOTE — Telephone Encounter (Signed)
Copied from CRM [phone]. Topic: Appointment Scheduling - Scheduling Inquiry for Clinic >> Jul 09, 2018 11:44 AM Windy KalataMichael, Taylor L, NT wrote: Reason for CRM: patient is calling and requesting to transfer care from Dr. Jimmey RalphParker to a provider accepting [facility]. He sates that he does not have a preference he is just wanting to go to that office due to closer location to his house. Please contact once the transfer process has be approved. >> Jul 23, 2018  3:50 PM Trula SladeWalter, Linda F wrote: Patient is calling and requesting to transfer care from Dr. Jimmey RalphParker to a provider accepting new patients at [facility].  He states that he does not have a preference, he is just wanting to go to that office due to closer location to his house. Please contact once the transfer process has be approved.

## 2018-07-23 NOTE — Telephone Encounter (Signed)
proceed w/ Dr Carmelia RollerWendling

## 2018-07-23 NOTE — Telephone Encounter (Signed)
Please advise 

## 2018-07-24 NOTE — Telephone Encounter (Signed)
Called left detailed message to call back.

## 2018-07-24 NOTE — Telephone Encounter (Signed)
PEC scheduled appt with patient.

## 2018-07-24 NOTE — Telephone Encounter (Signed)
Pt has an appt with dr Carmelia Rollerwendling on 08/23/18

## 2018-08-23 ENCOUNTER — Encounter: Payer: Self-pay | Admitting: Family Medicine

## 2018-08-23 ENCOUNTER — Ambulatory Visit (HOSPITAL_BASED_OUTPATIENT_CLINIC_OR_DEPARTMENT_OTHER)
Admission: RE | Admit: 2018-08-23 | Discharge: 2018-08-23 | Disposition: A | Discharge status: Home / Self Care | Payer: 59 | Source: Ambulatory Visit | Attending: Family Medicine | Admitting: Family Medicine

## 2018-08-23 ENCOUNTER — Ambulatory Visit: Payer: 59 | Admitting: Family Medicine

## 2018-08-23 VITALS — BP 130/82 | HR 75 | Temp 98.4°F | Ht 72.0 in | Wt 257.4 lb

## 2018-08-23 DIAGNOSIS — Z23 Encounter for immunization: Secondary | ICD-10-CM

## 2018-08-23 DIAGNOSIS — R05 Cough: Secondary | ICD-10-CM | POA: Insufficient documentation

## 2018-08-23 DIAGNOSIS — R059 Cough, unspecified: Secondary | ICD-10-CM

## 2018-08-23 DIAGNOSIS — F17200 Nicotine dependence, unspecified, uncomplicated: Secondary | ICD-10-CM | POA: Diagnosis not present

## 2018-08-23 NOTE — Progress Notes (Signed)
Chief Complaint  Patient presents with  . New Patient (Initial Visit)  . Cough    Steven Brooks here for cough.    Duration: 1 month  Associated symptoms: Cough with clear phlegm Denies: sinus congestion, sinus pain, rhinorrhea, ear pain, ear drainage, sore throat, myalgia and Fevers Treatment to date: none Sick contacts: No  He is a smoker with a 22.5-pack-year history  ROS:  Const: Denies fevers HEENT: As noted in HPI Lungs: + Cough  Past Medical History:  Diagnosis Date  . Asthma     BP 130/82 (BP Location: Left Arm, Patient Position: Sitting, Cuff Size: Large)   Pulse 75   Temp 98.4 F (36.9 C) (Oral)   Ht 6' (1.829 m)   Wt 257 lb 6 oz (116.7 kg)   SpO2 95%   BMI 34.91 kg/m  General: Awake, alert, appears stated age HEENT: AT, [employer], ears patent b/l and TM's neg, nares patent w/o discharge, pharynx pink and without exudates, MMM Neck: No masses or asymmetry Heart: RRR, no lower extremity edema Lungs: CTAB, no accessory muscle use Psych: Age appropriate judgment and insight, normal mood and affect  Cough - Plan: DG Chest 2 View  Need for vaccination against Streptococcus pneumoniae - Plan: Pneumococcal polysaccharide vaccine 23-valent greater than or equal to 2yo subcutaneous/IM  Nicotine dependence with current use  Check x-ray, if unremarkable, may start treatment for possible early COPD/aggravation of asthma.  Will treat infection if present. Counseled on tobacco cessation. F/u prn. If starting to experience fevers, shaking, or shortness of breath, seek immediate care. Pt voiced understanding and agreement to the plan.  Jilda Roche [place], DO 08/23/18 11:52 AM

## 2018-08-23 NOTE — Progress Notes (Signed)
Pre visit review using our clinic review tool, if applicable. No additional management support is needed unless otherwise documented below in the visit note. 

## 2018-08-23 NOTE — Patient Instructions (Signed)
Stop smoking.  We will be in touch regarding your chest X-ray results. This will dictate our next step be it an inhaler or a medicine to treat an infection.   Let us know if you need anything.

## 2018-10-05 ENCOUNTER — Ambulatory Visit (INDEPENDENT_AMBULATORY_CARE_PROVIDER_SITE_OTHER): Payer: 59 | Admitting: Family Medicine

## 2018-10-05 ENCOUNTER — Encounter: Payer: Self-pay | Admitting: Family Medicine

## 2018-10-05 VITALS — BP 128/84 | HR 67 | Temp 97.6°F | Ht 73.0 in | Wt 257.2 lb

## 2018-10-05 DIAGNOSIS — Z1159 Encounter for screening for other viral diseases: Secondary | ICD-10-CM

## 2018-10-05 DIAGNOSIS — Z0000 Encounter for general adult medical examination without abnormal findings: Secondary | ICD-10-CM | POA: Diagnosis not present

## 2018-10-05 DIAGNOSIS — Z23 Encounter for immunization: Secondary | ICD-10-CM

## 2018-10-05 DIAGNOSIS — Z114 Encounter for screening for human immunodeficiency virus [HIV]: Secondary | ICD-10-CM | POA: Diagnosis not present

## 2018-10-05 DIAGNOSIS — Z720 Tobacco use: Secondary | ICD-10-CM

## 2018-10-05 LAB — COMPREHENSIVE METABOLIC PANEL
ALT: 45 U/L (ref 0–53)
AST: 28 U/L (ref 0–37)
Albumin: 4.3 g/dL (ref 3.5–5.2)
Alkaline Phosphatase: 34 U/L — ABNORMAL LOW (ref 39–117)
BUN: 18 mg/dL (ref 6–23)
CALCIUM: 9.2 mg/dL (ref 8.4–10.5)
CHLORIDE: 105 meq/L (ref 96–112)
CO2: 27 meq/L (ref 19–32)
Creatinine, Ser: 0.97 mg/dL (ref 0.40–1.50)
GFR: 80.11 mL/min (ref >60.00)
GLUCOSE: 127 mg/dL — AB (ref 70–99)
POTASSIUM: 4.6 meq/L (ref 3.5–5.1)
Sodium: 141 mEq/L (ref 135–145)
Total Bilirubin: 0.7 mg/dL (ref 0.2–1.2)
Total Protein: 7.1 g/dL (ref 6.0–8.3)

## 2018-10-05 LAB — LIPID PANEL
CHOL/HDL RATIO: 3
Cholesterol: 107 mg/dL (ref 0–200)
HDL: 34.6 mg/dL — AB (ref >39.00)
LDL Cholesterol: 50 mg/dL (ref 0–99)
NONHDL: 72.25
TRIGLYCERIDES: 111 mg/dL (ref 0.0–149.0)
VLDL: 22.2 mg/dL (ref 0.0–40.0)

## 2018-10-05 NOTE — Progress Notes (Signed)
Chief Complaint  Patient presents with  . Annual Exam    Well Male Steven Brooks is here for a complete physical.   His last physical was >1 year ago.  Current diet: in general, diet "sucks".  Current exercise: works out with Systems analyst Weight trend: stable overall Daytime fatigue? No. Seat belt? Yes.    Health maintenance Colonoscopy- No Tetanus- No  HIV- No Hep C- No  29.25 pack year hx, will need lung ca screening next year.    Past Medical History:  Diagnosis Date  . Asthma      History reviewed. No pertinent surgical history.  Medications  Takes no medications.   Allergies No Known Allergies  Family History Family History  Problem Relation Age of Onset  . Arthritis Mother   . Asthma Mother   . Diabetes Mother   . Heart attack Mother   . Heart disease Mother   . Hypertension Mother   . Arthritis Father   . Cancer Father   . Depression Father   . Diabetes Father   . Hearing loss Father   . Heart attack Father   . Heart disease Father   . Hypertension Father   . Hearing loss Brother   . Depression Brother   . Hearing loss Sister   . Hearing loss Sister   . Hearing loss Brother   . Kidney disease Brother   . Hearing loss Brother     Review of Systems: Constitutional:  no fevers Eye:  no recent significant change in vision Ear/Nose/Mouth/Throat:  Ears:  no hearing loss Nose/Mouth/Throat:  no complaints of nasal congestion, no sore throat Cardiovascular:  no chest pain, no palpitations Respiratory:  no cough and no shortness of breath Gastrointestinal:  no abdominal pain, no change in bowel habits GU:  Male: negative for dysuria, frequency, and incontinence and negative for prostate symptoms Musculoskeletal/Extremities:  no pain, redness, or swelling of the joints Integumentary (Skin/Breast):  no abnormal skin lesions reported Neurologic:  no headaches Endocrine: No unexpected weight changes Hematologic/Lymphatic:  no abnormal  bleeding  Exam BP 128/84 (BP Location: Left Arm, Patient Position: Sitting, Cuff Size: Normal)   Pulse 67   Temp 97.6 F (36.4 C) (Oral)   Ht 6\' 1"  (1.854 m)   Wt 257 lb 4 oz (116.7 kg)   SpO2 96%   BMI 33.94 kg/m  General:  well developed, well nourished, in no apparent distress Skin:  no significant moles, warts, or growths Head:  no masses, lesions, or tenderness Eyes:  pupils equal and round, sclera anicteric without injection Ears:  canals without lesions, TMs shiny without retraction, no obvious effusion, no erythema Nose:  nares patent, septum midline, mucosa normal Throat/Pharynx:  lips and gingiva without lesion; tongue and uvula midline; non-inflamed pharynx; no exudates or postnasal drainage Neck: neck supple without adenopathy, thyromegaly, or masses Lungs:  clear to auscultation, breath sounds equal bilaterally, no respiratory distress Cardio:  regular rate and rhythm, no LE edema, no bruits Abdomen:  abdomen soft, nontender; bowel sounds normal; no masses or organomegaly Genital (male): Uncircumcised penis, no lesions or discharge; testes present bilaterally without masses or tenderness Rectal: Deferred Musculoskeletal:  symmetrical muscle groups noted without atrophy or deformity Extremities:  no clubbing, cyanosis, or edema, no deformities, no skin discoloration Neuro:  gait normal; deep tendon reflexes normal and symmetric Psych: well oriented with normal range of affect and appropriate judgment/insight  Assessment and Plan  Well adult exam - Plan: Comprehensive metabolic panel, Lipid panel  Encounter for hepatitis C screening test for low risk patient - Plan: Hepatitis C antibody  Screening for HIV (human immunodeficiency virus) - Plan: HIV Antibody (routine testing w rflx)  Tobacco abuse   Well 58 y.o. male. Counseled on diet and exercise. Counseled on tobacco cessation. Counseled on risks and benefits of prostate cancer screening with PSA. The patient  agrees to forego testing. Immunizations, labs, and further orders as above. Follow up in 1 yr or prn. The patient voiced understanding and agreement to the plan.  Jilda Roche [place], DO 10/05/18 8:43 AM

## 2018-10-05 NOTE — Patient Instructions (Addendum)
Give [place] 2-3 business days to get the results of your labs back.   Keep the diet clean and stay active.  Stop smoking.  Coping skills Choose 5 that work for you:  Take a deep breath  Count to 20  Read a book  Do a puzzle  Meditate  Bake  Sing  Knit  Garden  Pray  Go outside  Call a friend  Listen to music  Take a walk  Color  Send a note  Take a bath  Watch a movie  Be alone in a quiet place  Pet an animal  Visit a friend  Journal  Exercise  Stretch   Please consider counseling. Contact [phone] to schedule an appointment or inquire about cost/insurance coverage.  Let us know if you need anything.

## 2018-10-06 LAB — HEPATITIS C ANTIBODY
Hepatitis C Ab: NONREACTIVE
SIGNAL TO CUT-OFF: 0.21 (ref <1.00)

## 2018-10-06 LAB — HIV ANTIBODY (ROUTINE TESTING W REFLEX): HIV 1&2 Ab, 4th Generation: NONREACTIVE

## 2018-10-07 ENCOUNTER — Encounter: Payer: Self-pay | Admitting: Family Medicine

## 2018-10-08 ENCOUNTER — Other Ambulatory Visit: Payer: Self-pay | Admitting: Family Medicine

## 2018-10-08 DIAGNOSIS — R739 Hyperglycemia, unspecified: Secondary | ICD-10-CM

## 2018-10-10 ENCOUNTER — Other Ambulatory Visit (INDEPENDENT_AMBULATORY_CARE_PROVIDER_SITE_OTHER): Payer: 59

## 2018-10-10 ENCOUNTER — Encounter: Payer: Self-pay | Admitting: Family Medicine

## 2018-10-10 DIAGNOSIS — R7303 Prediabetes: Secondary | ICD-10-CM | POA: Insufficient documentation

## 2018-10-10 DIAGNOSIS — R739 Hyperglycemia, unspecified: Secondary | ICD-10-CM | POA: Diagnosis not present

## 2018-10-10 LAB — HEMOGLOBIN A1C: HEMOGLOBIN A1C: 6.1 % (ref 4.6–6.5)

## 2018-10-24 DIAGNOSIS — Z1211 Encounter for screening for malignant neoplasm of colon: Secondary | ICD-10-CM | POA: Diagnosis not present

## 2018-10-28 LAB — COLOGUARD

## 2018-10-29 ENCOUNTER — Telehealth: Payer: Self-pay | Admitting: Family Medicine

## 2018-10-29 DIAGNOSIS — R195 Other fecal abnormalities: Secondary | ICD-10-CM

## 2018-10-29 NOTE — Telephone Encounter (Signed)
Patient notified of results and he will see GI.  Referral placed.

## 2018-10-29 NOTE — Telephone Encounter (Signed)
Called the patient to inform Cologuard results were positive----Had to leave a detailed message to call us back. PCP would like him to have a GI Referral.

## 2018-11-13 ENCOUNTER — Encounter: Payer: Self-pay | Admitting: Family Medicine

## 2018-11-14 ENCOUNTER — Other Ambulatory Visit: Payer: Self-pay | Admitting: Family Medicine

## 2019-01-23 ENCOUNTER — Encounter: Payer: Self-pay | Admitting: Gastroenterology

## 2019-02-19 ENCOUNTER — Telehealth: Payer: Self-pay

## 2019-02-19 NOTE — Telephone Encounter (Signed)
Patient was scheduled for PV today at 11:00 Am. Patient was called twice. No answer. Message left to call before 5:00 Pm today to re-schedule PV. Patient was informed that his colonoscopy will be cancelled per [place] guidelines if he does not reschedule before 5:00 Pm. A no show letter will be mailed if patient does not call to reschedule.   Riki Sheer, LPN ( PV )

## 2019-02-20 ENCOUNTER — Encounter: Payer: Self-pay | Admitting: Family Medicine

## 2019-03-05 ENCOUNTER — Encounter: Payer: 59 | Admitting: Gastroenterology

## 2019-04-30 ENCOUNTER — Telehealth: Payer: Self-pay | Admitting: Family Medicine

## 2019-04-30 NOTE — Telephone Encounter (Signed)
Called pt to made flu shot appt. Left msg

## 2019-11-14 ENCOUNTER — Ambulatory Visit: Payer: 59

## 2020-01-08 NOTE — Progress Notes (Addendum)
[facility] [address], [address] [place], [place] [address] [phone]  Date:  01/09/2020   Name:  Steven Brooks   DOB:  [birth year]   MRN:  [id number]  PCP:  Sharlene Dory, DO    Chief Complaint: Chest Discomfort (Started last week, difficulty swallowing, tingling in chest/sharp, body numbness)   History of Present Illness:  Steven Brooks is a 59 y.o. very pleasant male patient who presents with the following:  Patient with history of prediabetes, asthma, smoking Primary patient of my partner Dr. Arlee Muslim have not seen him myself previously Here today with concern of sore throat for about one week- now feeling better.  He notes he still has some pain if he has to swallow harder foods like an apple.   Today is Thursday- on Tuesday he had a "tingly feeling and sharp pain across my chest, and that part of my body went numb for 20-30 seconds."  He had this feeling on his bilateral trunk, resolved so he thought he was ok This occurred once also a week ago, but not again since this past Tuesday He notes that he stopped in his tracks, waiting for it to go away- it did resolve so he thought this was all right  This was not associated with any facial drooping, slurred speech, one-sided numbness or weakness, or difficulty swallowing  No fever or chills noted He has a mild cough as is normal since he smokes- no change here No vomiting or diarrhea No SOB He walks his dog about 2 miles and 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 Prediabetes   . Tobacco abuse 10/05/2018  . Other fatigue 09/26/2017  . Left knee pain 09/12/2017  . Nicotine dependence with current use 09/12/2017    Past Medical History:  Diagnosis Date  . Asthma   . Prediabetes     History reviewed. No pertinent surgical history.  Social History   Tobacco Use  . Smoking status: Current Every Day Smoker    Packs/day: 0.75    Types: Cigarettes    Start date: 12/05/1979  . Smokeless tobacco: Never Used  Substance Use Topics  . Alcohol use: Yes    Comment: Very Rarely  . Drug use: No    Family History  Problem Relation Age of Onset  . Arthritis Mother   . Asthma Mother   . Diabetes Mother   . Heart attack Mother   . Heart disease Mother   . Hypertension Mother   . Arthritis Father   . Cancer Father   . Depression Father   . Diabetes Father   . Hearing loss Father   . Heart attack Father   . Heart disease Father   . Hypertension Father   . Hearing loss Brother   . Depression Brother   . Hearing loss Sister   . Hearing loss Sister   . Hearing loss Brother   . Kidney disease Brother   . Hearing loss Brother  No Known Allergies  Medication list has been reviewed and updated.  No current outpatient medications on file prior to visit.   No current facility-administered medications on file prior to visit.    Review of Systems:  As per HPI- otherwise negative.   Physical Examination: Vitals:   01/09/20 0839  BP: 112/71  Pulse: (!) 59  Resp: 18  Temp: 97.6 F (36.4 C)  SpO2: 95%   Vitals:   01/09/20 0839  Weight: 248 lb (112.5 kg)  Height: 6\' 1"  (1.854 m)   Body mass index is 32.72 kg/m. Ideal Body Weight: Weight in (lb) to have BMI = 25: 189.1  GEN: no acute distress.  Obese, looks well  HEENT: Atraumatic, Normocephalic.   Bilateral TM wnl, oropharynx injected but no exudate.  PEERL,EOMI.   Ears and Nose: No external deformity. CV: RRR, No M/G/R. No JVD. No thrill. No extra heart  sounds. PULM: CTA B, no wheezes, crackles, rhonchi. No retractions. No resp. distress. No accessory muscle use. ABD: S, NT, ND, +BS. No rebound. No HSM. EXTR: No c/c/e PSYCH: Normally interactive. Conversant.  Normal strength, sensation, DTR both upper extremities.  Normal gait and leg strength.  Negative Romberg.  No facial drooping, tongue midline with normal posterior oropharynx elevation  EKG: NSR, rate of 60 No old tracings for comparison  Results for orders placed or performed in visit on 01/09/20  POCT rapid strep A  Result Value Ref Range   Rapid Strep A Screen Negative Negative    Assessment and Plan: Numbness - Plan: EKG 12-Lead, 01/11/20 Carotid Duplex Bilateral, MR Brain W Wo Contrast  Pharyngitis, unspecified etiology - Plan: POCT rapid strep A, penicillin v potassium (VEETID) 500 MG tablet  Pre-diabetes - Plan: Comprehensive metabolic panel, Hemoglobin A1c  Tobacco abuse  Screening for thyroid disorder - Plan: TSH  Screening for hyperlipidemia - Plan: Lipid panel  Screening for prostate cancer - Plan: PSA  Screening for deficiency anemia - Plan: CBC  Patient today with a couple of concerns.  He has had a sore throat for about a week, his throat is injected on exam today although rapid strep is negative.  Will treat with penicillin He is overdue for routine labs, will draw blood for him today Patient also noted 2 recent episodes of brief (20 to 30 seconds) numbness across both sides of his chest-no other neurologic symptoms noted.  No chest pain or shortness of breath.  We discussed this today in detail.  This may be a benign phenomenon, or could signify something more serious.  We will plan to pursue carotid ultrasound, patient would also like to go ahead with MRI of brain in case this was a TIA He is advised to seek immediate care if this phenomenon occurs again, especially if it lasts for a longer period of time Moderate medical decision making today  This visit  occurred during the SARS-CoV-2 public health emergency.  Safety protocols were in place, including screening questions prior to the visit, additional usage of staff PPE, and extensive cleaning of exam room while observing appropriate contact time as indicated for disinfecting solutions.    Signed 08-06-1992, MD  Received his labs, message to pt  Results for orders placed or performed in visit on 01/09/20  CBC  Result Value Ref Range   WBC 7.7 4.0 - 10.5 K/uL   RBC 5.29 4.22 - 5.81 Mil/uL   Platelets 313.0 150.0 - 400.0 K/uL   Hemoglobin 16.0 13.0 - 17.0 g/dL  HCT 47.2 39.0 - 52.0 %   MCV 89.2 78.0 - 100.0 fl   MCHC 34.0 30.0 - 36.0 g/dL   RDW 13.3 11.5 - 15.5 %  Comprehensive metabolic panel  Result Value Ref Range   Sodium 138 135 - 145 mEq/L   Potassium 4.6 3.5 - 5.1 mEq/L   Chloride 105 96 - 112 mEq/L   CO2 29 19 - 32 mEq/L   Glucose, Bld 144 (H) 70 - 99 mg/dL   BUN 27 (H) 6 - 23 mg/dL   Creatinine, Ser 0.91 0.40 - 1.50 mg/dL   Total Bilirubin 0.5 0.2 - 1.2 mg/dL   Alkaline Phosphatase 40 39 - 117 U/L   AST 24 0 - 37 U/L   ALT 36 0 - 53 U/L   Total Protein 7.1 6.0 - 8.3 g/dL   Albumin 4.4 3.5 - 5.2 g/dL   GFR 85.84 >60.00 mL/min   Calcium 9.3 8.4 - 10.5 mg/dL  Hemoglobin A1c  Result Value Ref Range   Hgb A1c MFr Bld 6.1 4.6 - 6.5 %  Lipid panel  Result Value Ref Range   Cholesterol 109 0 - 200 mg/dL   Triglycerides 121.0 0.0 - 149.0 mg/dL   HDL 33.20 (L) >39.00 mg/dL   VLDL 24.2 0.0 - 40.0 mg/dL   LDL Cholesterol 52 0 - 99 mg/dL   Total CHOL/HDL Ratio 3    NonHDL 75.82   TSH  Result Value Ref Range   TSH 1.42 0.35 - 4.50 uIU/mL  PSA  Result Value Ref Range   PSA 0.62 0.10 - 4.00 ng/mL  POCT rapid strep A  Result Value Ref Range   Rapid Strep A Screen Negative Negative

## 2020-01-09 ENCOUNTER — Other Ambulatory Visit: Payer: Self-pay

## 2020-01-09 ENCOUNTER — Ambulatory Visit: Payer: 59 | Admitting: Family Medicine

## 2020-01-09 ENCOUNTER — Encounter: Payer: Self-pay | Admitting: Family Medicine

## 2020-01-09 VITALS — BP 112/71 | HR 59 | Temp 97.6°F | Resp 18 | Ht 73.0 in | Wt 248.0 lb

## 2020-01-09 DIAGNOSIS — Z13220 Encounter for screening for lipoid disorders: Secondary | ICD-10-CM

## 2020-01-09 DIAGNOSIS — R7303 Prediabetes: Secondary | ICD-10-CM | POA: Diagnosis not present

## 2020-01-09 DIAGNOSIS — Z130 Encounter for screening for diseases of the blood and blood-forming organs and certain disorders involving the immune mechanism: Secondary | ICD-10-CM

## 2020-01-09 DIAGNOSIS — Z720 Tobacco use: Secondary | ICD-10-CM

## 2020-01-09 DIAGNOSIS — J029 Acute pharyngitis, unspecified: Secondary | ICD-10-CM | POA: Diagnosis not present

## 2020-01-09 DIAGNOSIS — F40240 Claustrophobia: Secondary | ICD-10-CM

## 2020-01-09 DIAGNOSIS — R200 Anesthesia of skin: Secondary | ICD-10-CM | POA: Diagnosis not present

## 2020-01-09 DIAGNOSIS — Z1329 Encounter for screening for other suspected endocrine disorder: Secondary | ICD-10-CM | POA: Diagnosis not present

## 2020-01-09 DIAGNOSIS — Z125 Encounter for screening for malignant neoplasm of prostate: Secondary | ICD-10-CM

## 2020-01-09 LAB — LIPID PANEL
Cholesterol: 109 mg/dL (ref 0–200)
HDL: 33.2 mg/dL — ABNORMAL LOW (ref >39.00)
LDL Cholesterol: 52 mg/dL (ref 0–99)
NonHDL: 75.82
Total CHOL/HDL Ratio: 3
Triglycerides: 121 mg/dL (ref 0.0–149.0)
VLDL: 24.2 mg/dL (ref 0.0–40.0)

## 2020-01-09 LAB — CBC
HCT: 47.2 % (ref 39.0–52.0)
Hemoglobin: 16 g/dL (ref 13.0–17.0)
MCHC: 34 g/dL (ref 30.0–36.0)
MCV: 89.2 fl (ref 78.0–100.0)
Platelets: 313 10*3/uL (ref 150.0–400.0)
RBC: 5.29 Mil/uL (ref 4.22–5.81)
RDW: 13.3 % (ref 11.5–15.5)
WBC: 7.7 10*3/uL (ref 4.0–10.5)

## 2020-01-09 LAB — TSH: TSH: 1.42 u[IU]/mL (ref 0.35–4.50)

## 2020-01-09 LAB — COMPREHENSIVE METABOLIC PANEL
ALT: 36 U/L (ref 0–53)
AST: 24 U/L (ref 0–37)
Albumin: 4.4 g/dL (ref 3.5–5.2)
Alkaline Phosphatase: 40 U/L (ref 39–117)
BUN: 27 mg/dL — ABNORMAL HIGH (ref 6–23)
CO2: 29 mEq/L (ref 19–32)
Calcium: 9.3 mg/dL (ref 8.4–10.5)
Chloride: 105 mEq/L (ref 96–112)
Creatinine, Ser: 0.91 mg/dL (ref 0.40–1.50)
GFR: 85.84 mL/min (ref >60.00)
Glucose, Bld: 144 mg/dL — ABNORMAL HIGH (ref 70–99)
Potassium: 4.6 mEq/L (ref 3.5–5.1)
Sodium: 138 mEq/L (ref 135–145)
Total Bilirubin: 0.5 mg/dL (ref 0.2–1.2)
Total Protein: 7.1 g/dL (ref 6.0–8.3)

## 2020-01-09 LAB — POCT RAPID STREP A (OFFICE): Rapid Strep A Screen: NEGATIVE

## 2020-01-09 LAB — HEMOGLOBIN A1C: Hgb A1c MFr Bld: 6.1 % (ref 4.6–6.5)

## 2020-01-09 LAB — PSA: PSA: 0.62 ng/mL (ref 0.10–4.00)

## 2020-01-09 MED ORDER — PENICILLIN V POTASSIUM 500 MG PO TABS: 500.0000 mg | ORAL_TABLET | Freq: Three times a day (TID) | ORAL | 0 refills | Status: DC

## 2020-01-09 MED ORDER — LORAZEPAM 0.5 MG PO TABS: ORAL_TABLET | ORAL | 0 refills | Status: DC

## 2020-01-09 NOTE — Patient Instructions (Addendum)
I will be in touch with your labs asap We will set you up for an MRI of your head and carotid ultrasound  Please take an aspirin daily- 81- 325 mg daily  Your rapid strep is negative- however, let's treat you with penicillin in case.  I sent this rx to your drug store  If you have any other episodes of numbness please seek care!

## 2020-01-17 ENCOUNTER — Other Ambulatory Visit: Payer: Self-pay

## 2020-01-17 ENCOUNTER — Encounter: Payer: Self-pay | Admitting: Family Medicine

## 2020-01-17 ENCOUNTER — Ambulatory Visit: Payer: 59

## 2020-01-17 ENCOUNTER — Ambulatory Visit (HOSPITAL_BASED_OUTPATIENT_CLINIC_OR_DEPARTMENT_OTHER): Admission: RE | Admit: 2020-01-17 | Payer: 59 | Source: Ambulatory Visit

## 2020-01-17 DIAGNOSIS — R200 Anesthesia of skin: Secondary | ICD-10-CM

## 2020-01-20 ENCOUNTER — Other Ambulatory Visit: Payer: Self-pay

## 2020-01-20 ENCOUNTER — Ambulatory Visit (INDEPENDENT_AMBULATORY_CARE_PROVIDER_SITE_OTHER): Payer: 59

## 2020-01-20 DIAGNOSIS — R200 Anesthesia of skin: Secondary | ICD-10-CM

## 2020-01-20 MED ORDER — GADOBUTROL 1 MMOL/ML IV SOLN
10.0000 mL | Freq: Once | INTRAVENOUS | Status: AC | PRN
Start: 2020-01-20 — End: 2020-01-20
  Administered 2020-01-20: 10 mL via INTRAVENOUS

## 2020-01-22 ENCOUNTER — Encounter: Payer: Self-pay | Admitting: Family Medicine

## 2020-02-12 ENCOUNTER — Other Ambulatory Visit: Payer: 59

## 2020-05-26 ENCOUNTER — Ambulatory Visit (INDEPENDENT_AMBULATORY_CARE_PROVIDER_SITE_OTHER): Payer: 59

## 2020-05-26 ENCOUNTER — Other Ambulatory Visit: Payer: Self-pay

## 2020-05-26 DIAGNOSIS — Z23 Encounter for immunization: Secondary | ICD-10-CM

## 2020-06-22 ENCOUNTER — Other Ambulatory Visit: Payer: Self-pay | Admitting: Family Medicine

## 2020-11-24 ENCOUNTER — Other Ambulatory Visit: Payer: Self-pay

## 2020-11-24 ENCOUNTER — Encounter: Payer: Self-pay | Admitting: Family Medicine

## 2020-11-24 ENCOUNTER — Telehealth (INDEPENDENT_AMBULATORY_CARE_PROVIDER_SITE_OTHER): Payer: 59 | Admitting: Family Medicine

## 2020-11-24 DIAGNOSIS — J301 Allergic rhinitis due to pollen: Secondary | ICD-10-CM

## 2020-11-24 DIAGNOSIS — J4521 Mild intermittent asthma with (acute) exacerbation: Secondary | ICD-10-CM | POA: Diagnosis not present

## 2020-11-24 MED ORDER — PREDNISONE 20 MG PO TABS: 40.0000 mg | ORAL_TABLET | Freq: Every day | ORAL | 0 refills | Status: AC

## 2020-11-24 MED ORDER — LEVOCETIRIZINE DIHYDROCHLORIDE 5 MG PO TABS: 5.0000 mg | ORAL_TABLET | Freq: Every evening | ORAL | 2 refills | Status: DC

## 2020-11-24 NOTE — Progress Notes (Signed)
Chief Complaint  Patient presents with  . Nasal Congestion  . Cough    Steven Brooks here for URI complaints. Due to COVID-19 pandemic, we are interacting via web portal for an electronic face-to-face visit. I verified patient's ID using 2 identifiers. Patient agreed to proceed with visit via this method. Patient is at home, I am at office. Patient and I are present for visit.   Duration: 2 days  Associated symptoms: sinus congestion, chest tightness and cough Denies: sinus pain, rhinorrhea, itchy watery eyes, ear pain, ear drainage, sore throat, wheezing, shortness of breath, myalgia and fevers Treatment to date: Nyquil, Mucinex Sick contacts: No  Past Medical History:  Diagnosis Date  . Asthma   . Prediabetes    Objective No conversational dyspnea Age appropriate judgment and insight Nml affect and mood  Seasonal allergic rhinitis due to pollen - Plan: levocetirizine (XYZAL) 5 MG tablet  Mild intermittent asthma with exacerbation - Plan: predniSONE (DELTASONE) 20 MG tablet  Xyzal prn. 5 d pred burst to cover for 1 and 2, more for 2. He has been on steroid sin past.  Continue to push fluids, practice good hand hygiene, cover mouth when coughing. F/u for CPE at convenience. If starting to experience fevers, shaking, or shortness of breath, seek immediate care. Pt voiced understanding and agreement to the plan.  Jilda Roche [place], DO 11/24/20 8:05 AM

## 2021-03-03 ENCOUNTER — Other Ambulatory Visit: Payer: Self-pay

## 2021-03-03 ENCOUNTER — Encounter: Payer: Self-pay | Admitting: Family Medicine

## 2021-03-03 ENCOUNTER — Ambulatory Visit (INDEPENDENT_AMBULATORY_CARE_PROVIDER_SITE_OTHER): Payer: 59 | Admitting: Family Medicine

## 2021-03-03 VITALS — BP 120/80 | HR 81 | Temp 98.2°F | Ht 72.0 in | Wt 256.5 lb

## 2021-03-03 DIAGNOSIS — Z720 Tobacco use: Secondary | ICD-10-CM | POA: Diagnosis not present

## 2021-03-03 DIAGNOSIS — Z0000 Encounter for general adult medical examination without abnormal findings: Secondary | ICD-10-CM | POA: Diagnosis not present

## 2021-03-03 DIAGNOSIS — Z122 Encounter for screening for malignant neoplasm of respiratory organs: Secondary | ICD-10-CM | POA: Diagnosis not present

## 2021-03-03 DIAGNOSIS — N529 Male erectile dysfunction, unspecified: Secondary | ICD-10-CM

## 2021-03-03 DIAGNOSIS — R195 Other fecal abnormalities: Secondary | ICD-10-CM

## 2021-03-03 DIAGNOSIS — Z125 Encounter for screening for malignant neoplasm of prostate: Secondary | ICD-10-CM | POA: Diagnosis not present

## 2021-03-03 LAB — LIPID PANEL
Cholesterol: 108 mg/dL (ref 0–200)
HDL: 33.8 mg/dL — ABNORMAL LOW (ref >39.00)
LDL Cholesterol: 51 mg/dL (ref 0–99)
NonHDL: 74.05
Total CHOL/HDL Ratio: 3
Triglycerides: 113 mg/dL (ref 0.0–149.0)
VLDL: 22.6 mg/dL (ref 0.0–40.0)

## 2021-03-03 LAB — COMPREHENSIVE METABOLIC PANEL
ALT: 60 U/L — ABNORMAL HIGH (ref 0–53)
AST: 38 U/L — ABNORMAL HIGH (ref 0–37)
Albumin: 4.5 g/dL (ref 3.5–5.2)
Alkaline Phosphatase: 42 U/L (ref 39–117)
BUN: 23 mg/dL (ref 6–23)
CO2: 28 mEq/L (ref 19–32)
Calcium: 9.5 mg/dL (ref 8.4–10.5)
Chloride: 105 mEq/L (ref 96–112)
Creatinine, Ser: 0.87 mg/dL (ref 0.40–1.50)
GFR: 95.29 mL/min (ref >60.00)
Glucose, Bld: 140 mg/dL — ABNORMAL HIGH (ref 70–99)
Potassium: 4.6 mEq/L (ref 3.5–5.1)
Sodium: 139 mEq/L (ref 135–145)
Total Bilirubin: 0.6 mg/dL (ref 0.2–1.2)
Total Protein: 7.3 g/dL (ref 6.0–8.3)

## 2021-03-03 LAB — CBC
HCT: 50 % (ref 39.0–52.0)
Hemoglobin: 16.9 g/dL (ref 13.0–17.0)
MCHC: 33.9 g/dL (ref 30.0–36.0)
MCV: 88.6 fl (ref 78.0–100.0)
Platelets: 279 10*3/uL (ref 150.0–400.0)
RBC: 5.64 Mil/uL (ref 4.22–5.81)
RDW: 13.5 % (ref 11.5–15.5)
WBC: 7.9 10*3/uL (ref 4.0–10.5)

## 2021-03-03 LAB — HEMOGLOBIN A1C: Hgb A1c MFr Bld: 6.7 % — ABNORMAL HIGH (ref 4.6–6.5)

## 2021-03-03 LAB — PSA: PSA: 0.52 ng/mL (ref 0.10–4.00)

## 2021-03-03 MED ORDER — TADALAFIL 20 MG PO TABS: 10.0000 mg | ORAL_TABLET | ORAL | 5 refills | Status: DC | PRN

## 2021-03-03 MED ORDER — VARENICLINE TARTRATE 0.5 MG X 11 & 1 MG X 42 PO MISC: ORAL | 0 refills | Status: DC

## 2021-03-03 NOTE — Patient Instructions (Addendum)
Give [place] 2-3 business days to get the results of your labs back.   Keep the diet clean and stay active.  The new Shingrix vaccine (for shingles) is a 2 shot series. It can make people feel low energy, achy and almost like they have the flu for 48 hours after injection. Please plan accordingly when deciding on when to get this shot. Call our office for a nurse visit appointment to get this. The second shot of the series is less severe regarding the side effects, but it still lasts 48 hours.   If you do not hear anything about your referral in the next 1-2 weeks, call our office and ask for an update.  Please consider getting your 2nd covid booster vaccine.   Let us know if you need anything.

## 2021-03-03 NOTE — Progress Notes (Signed)
Chief Complaint  Patient presents with   Annual Exam    Well Male Steven Brooks is here for a complete physical.   His last physical was >1 year ago.  Current diet: in general, diet has been better.  Current exercise: none Weight trend: has gained some this year Fatigue out of ordinary? No. Seat belt? Yes.    Health maintenance Shingrix- No Colonoscopy- No Tetanus- Yes HIV- Yes Hep C- Yes Lung cancer screening- No   Past Medical History:  Diagnosis Date   Asthma    Prediabetes      No past surgical history on file.  Medications  Current Outpatient Medications on File Prior to Visit  Medication Sig Dispense Refill   levocetirizine (XYZAL) 5 MG tablet Take 1 tablet (5 mg total) by mouth every evening. 30 tablet 2    Allergies No Known Allergies  Family History Family History  Problem Relation Age of Onset   Arthritis Mother    Asthma Mother    Diabetes Mother    Heart attack Mother    Heart disease Mother    Hypertension Mother    Arthritis Father    Cancer Father    Depression Father    Diabetes Father    Hearing loss Father    Heart attack Father    Heart disease Father    Hypertension Father    Hearing loss Brother    Depression Brother    Hearing loss Sister    Hearing loss Sister    Hearing loss Brother    Kidney disease Brother    Hearing loss Brother     Review of Systems: Constitutional:  no fevers Eye:  no recent significant change in vision Ear/Nose/Mouth/Throat:  Ears:  no hearing loss Nose/Mouth/Throat:  no complaints of nasal congestion, no sore throat Cardiovascular:  no chest pain Respiratory:  no shortness of breath Gastrointestinal:  no change in bowel habits GU:  Male: negative for dysuria, frequency Musculoskeletal/Extremities:  no joint pain Integumentary (Skin/Breast):  no abnormal skin lesions reported Neurologic:  no headaches Endocrine: No unexpected weight changes Hematologic/Lymphatic:  no abnormal  bleeding  Exam BP 120/80   Pulse 81   Temp 98.2 F (36.8 C) (Oral)   Ht 6' (1.829 m)   Wt 256 lb 8 oz (116.3 kg)   SpO2 96%   BMI 34.79 kg/m  General:  well developed, well nourished, in no apparent distress Skin:  no significant moles, warts, or growths Head:  no masses, lesions, or tenderness Eyes:  pupils equal and round, sclera anicteric without injection Ears:  canals without lesions, TMs shiny without retraction, no obvious effusion, no erythema Nose:  nares patent, septum midline, mucosa normal Throat/Pharynx:  lips and gingiva without lesion; tongue and uvula midline; non-inflamed pharynx; no exudates or postnasal drainage Neck: neck supple without adenopathy, thyromegaly, or masses Cardiac: RRR, no bruits, no LE edema Lungs:  clear to auscultation, breath sounds equal bilaterally, no respiratory distress Abdomen: BS+, soft, non-tender, non-distended, no masses or organomegaly noted Rectal: Deferred Musculoskeletal:  symmetrical muscle groups noted without atrophy or deformity Neuro:  gait normal; deep tendon reflexes normal and symmetric Psych: well oriented with normal range of affect and appropriate judgment/insight  Assessment and Plan  Well adult exam - Plan: Lipid panel, Hemoglobin A1c, CBC, Comprehensive metabolic panel  Tobacco abuse - Plan: varenicline (CHANTIX PAK) 0.5 MG X 11 & 1 MG X 42 tablet  Positive colorectal cancer screening using Cologuard test - Plan: Ambulatory referral to Gastroenterology  Screening for lung cancer - Plan: CT CHEST LUNG CANCER SCREENING LOW DOSE WO CONTRAST  Screening for prostate cancer - Plan: PSA  Erectile dysfunction, unspecified erectile dysfunction type - Plan: tadalafil (CIALIS) 20 MG tablet   Well 60 y.o. male. Counseled on diet and exercise. Counseled on risks and benefits of prostate cancer screening with PSA. The patient agrees to undergo testing. Tobacco: Counseled on cessation. Start Chantix. ED: Trial Cialis.  Rec'd using Goodrx.  Immunizations, labs, and further orders as above. Follow up in 1 yr or prn. The patient voiced understanding and agreement to the plan.  Jilda Roche [place], DO 03/03/21 10:24 AM

## 2021-03-10 NOTE — Telephone Encounter (Signed)
Patient called and put on Dr.Kim's schedule

## 2021-03-11 ENCOUNTER — Telehealth (INDEPENDENT_AMBULATORY_CARE_PROVIDER_SITE_OTHER): Payer: 59 | Admitting: Family Medicine

## 2021-03-11 ENCOUNTER — Encounter: Payer: Self-pay | Admitting: Family Medicine

## 2021-03-11 DIAGNOSIS — U071 COVID-19: Secondary | ICD-10-CM

## 2021-03-11 MED ORDER — NIRMATRELVIR/RITONAVIR (PAXLOVID)TABLET: 3.0000 | ORAL_TABLET | Freq: Two times a day (BID) | ORAL | 0 refills | Status: AC

## 2021-03-11 MED ORDER — BENZONATATE 100 MG PO CAPS: 100.0000 mg | ORAL_CAPSULE | Freq: Three times a day (TID) | ORAL | 0 refills | Status: DC | PRN

## 2021-03-11 NOTE — Patient Instructions (Signed)
---------------------------------------------------------------------------------------------------------------------------    WORK SLIP:  Patient Steven Brooks,  [birth year], was seen for a medical visit today, 03/11/21 . Please excuse from work for a COVID like illness. We advise 10 days minimum from the onset of symptoms (03/09/21) PLUS 1 day of no fever and improved symptoms. Will defer to employer for a sooner return to work if symptoms have resolved, it is greater than 5 days since the positive test and the patient can wear a high-quality, tight fitting mask such as N95 or KN95 at all times for an additional 5 days. Would also suggest COVID19 antigen testing is negative prior to return.  Sincerely: E-signature: Dr. Colin Benton, DO [facility] Ph: [phone]   ------------------------------------------------------------------------------------------------------------------------------  HOME CARE TIPS:   -I sent the medication(s) we discussed to your pharmacy: Meds ordered this encounter  Medications   nirmatrelvir/ritonavir EUA (PAXLOVID) TABS    Sig: Take 3 tablets by mouth 2 (two) times daily for 5 days. (Take nirmatrelvir 150 mg two tablets twice daily for 5 days and ritonavir 100 mg one tablet twice daily for 5 days) Patient GFR is 95    Dispense:  30 tablet    Refill:  0   benzonatate (TESSALON PERLES) 100 MG capsule    Sig: Take 1 capsule (100 mg total) by mouth 3 (three) times daily as needed.    Dispense:  20 capsule    Refill:  0      -I sent in the [facility] treatment or referral you requested per our discussion. Please see the information provided below and discuss further with the pharmacist/treatment team.   -can use tylenol or aleve if needed for fevers, aches and pains per instructions  -can use nasal saline a few times per day if you have nasal congestion; sometimes  a short course of Afrin nasal spray for 3 days can help with  symptoms as well  -stay hydrated, drink plenty of fluids and eat small healthy meals - avoid dairy  -check out the [facility] website for more information on home care, transmission and treatment for COVID19  -follow up with your doctor in 2-3 days unless improving and feeling better  -stay home while sick, except to seek medical care. If you have COVID19, ideally it would be best to stay home for a full 10 days since the onset of symptoms PLUS one day of no fever and feeling better. Wear a good mask that fits snugly (such as N95 or KN95) if around others to reduce the risk of transmission.  It was nice to meet you today, and I really hope you are feeling better soon. I help [place] out with telemedicine visits on Tuesdays and Thursdays and am available for visits on those days. If you have any concerns or questions following this visit please schedule a follow up visit with your Primary Care doctor or seek care at a local urgent care clinic to avoid delays in care.    Seek in person care or schedule a follow up video visit promptly if your symptoms worsen, new concerns arise or you are not improving with treatment. Call 911 and/or seek emergency care if your symptoms are severe or life threatening.  FACT SHEET FOR PATIENTS, PARENTS, AND CAREGIVERS EMERGENCY USE AUTHORIZATION (EUA) OF PAXLOVID FOR CORONAVIRUS DISEASE 2019 (COVID-19) You are being given this Fact Sheet because your healthcare provider believes it is necessary to provide you with PAXLOVID for the treatment of mild-to-moderate coronavirus disease (COVID-19) caused  by the SARS-CoV-2 virus. This Fact Sheet contains information to help you understand the risks and benefits of taking the PAXLOVID you have received or may receive. The U.S. Food and Drug Administration (FDA) has issued an Emergency Use Authorization (EUA) to make PAXLOVID available during the COVID-19 pandemic (for more details about an EUA please see "What is an  Emergency Use Authorization?" at the end of this document). PAXLOVID is not an FDA-approved medicine in the [place]. Read this Fact Sheet for information about PAXLOVID. Talk to your healthcare provider about your options or if you have any questions. It is your choice to take PAXLOVID.  What is COVID-19? COVID-19 is caused by a virus called a coronavirus. You can get COVID-19 through close contact with another person who has the virus. COVID-19 illnesses have ranged from very mild-to-severe, including illness resulting in death. While information so far suggests that most COVID-19 illness is mild, serious illness can happen and may cause some of your other medical conditions to become worse. Older people and people of all ages with severe, long lasting (chronic) medical conditions like heart disease, lung disease, and diabetes, for example seem to be at higher risk of being hospitalized for COVID-19.  What is PAXLOVID? PAXLOVID is an investigational medicine used to treat mild-to-moderate COVID-19 in adults and children [55 years of age and older weighing at least 59 pounds (78 kg)] with positive results of direct SARS-CoV-2 viral testing, and who are at high risk for progression to severe COVID-19, including hospitalization or death. PAXLOVID is investigational because it is still being studied. There is limited information about the safety and effectiveness of using PAXLOVID to treat people with mild-to-moderate COVID-19.  The FDA has authorized the emergency use of PAXLOVID for the treatment of mild-tomoderate COVID-19 in adults and children [93 years of age and older weighing at least 53 pounds (84 kg)] with a positive test for the virus that causes COVID-19, and who are at high risk for progression to severe COVID-19, including hospitalization or death, under an EUA. 1 Revised: 06 November 2020   What should I tell my healthcare provider before I take PAXLOVID? Tell your  healthcare provider if you: ? Have any allergies ? Have liver or kidney disease ? Are pregnant or plan to become pregnant ? Are breastfeeding a child ? Have any serious illnesses  Tell your healthcare provider about all the medicines you take, including prescription and over-the-counter medicines, vitamins, and herbal supplements. Some medicines may interact with PAXLOVID and may cause serious side effects. Keep a list of your medicines to show your healthcare provider and pharmacist when you get a new medicine.  You can ask your healthcare provider or pharmacist for a list of medicines that interact with PAXLOVID. Do not start taking a new medicine without telling your healthcare provider. Your healthcare provider can tell you if it is safe to take PAXLOVID with other medicines.  Tell your healthcare provider if you are taking combined hormonal contraceptive. PAXLOVID may affect how your birth control pills work. Females who are able to become pregnant should use another effective alternative form of contraception or an additional barrier method of contraception. Talk to your healthcare provider if you have any questions about contraceptive methods that might be right for you.  How do I take PAXLOVID? ? PAXLOVID consists of 2 medicines: nirmatrelvir and ritonavir. o Take 2 pink tablets of nirmatrelvir with 1 white tablet of ritonavir by mouth 2 times each day (in the morning  and in the evening) for 5 days. For each dose, take all 3 tablets at the same time. o If you have kidney disease, talk to your healthcare provider. You may need a different dose. ? Swallow the tablets whole. Do not chew, break, or crush the tablets. ? Take PAXLOVID with or without food. ? Do not stop taking PAXLOVID without talking to your healthcare provider, even if you feel better. ? If you miss a dose of PAXLOVID within 8 hours of the time it is usually taken, take it as soon as you remember. If you miss  a dose by more than 8 hours, skip the missed dose and take the next dose at your regular time. Do not take 2 doses of PAXLOVID at the same time. ? If you take too much PAXLOVID, call your healthcare provider or go to the nearest hospital emergency room right away. ? If you are taking a ritonavir- or cobicistat-containing medicine to treat hepatitis C or Human Immunodeficiency Virus (HIV), you should continue to take your medicine as prescribed by your healthcare provider. 2 Revised: 06 November 2020    Talk to your healthcare provider if you do not feel better or if you feel worse after 5 days.  Who should generally not take PAXLOVID? Do not take PAXLOVID if: ? You are allergic to nirmatrelvir, ritonavir, or any of the ingredients in PAXLOVID. ? You are taking any of the following medicines: o Alfuzosin o Pethidine, propoxyphene o Ranolazine o Amiodarone, dronedarone, flecainide, propafenone, quinidine o Colchicine o Lurasidone, pimozide, clozapine o Dihydroergotamine, ergotamine, methylergonovine o Lovastatin, simvastatin o Sildenafil (Revatio) for pulmonary arterial hypertension (PAH) o Triazolam, oral midazolam o Apalutamide o Carbamazepine, phenobarbital, phenytoin o Rifampin o St. John's Wort (hypericum perforatum) Taking PAXLOVID with these medicines may cause serious or life-threatening side effects or affect how PAXLOVID works.  These are not the only medicines that may cause serious side effects if taken with PAXLOVID. PAXLOVID may increase or decrease the levels of multiple other medicines. It is very important to tell your healthcare provider about all of the medicines you are taking because additional laboratory tests or changes in the dose of your other medicines may be necessary while you are taking PAXLOVID. Your healthcare provider may also tell you about specific symptoms to watch out for that may indicate that you need to stop or decrease the dose of some of  your other medicines.  What are the important possible side effects of PAXLOVID? Possible side effects of PAXLOVID are: ? Allergic Reactions. Allergic reactions can happen in people taking PAXLOVID, even after only 1 dose. Stop taking PAXLOVID and call your healthcare provider right away if you get any of the following symptoms of an allergic reaction: o hives o trouble swallowing or breathing o swelling of the mouth, lips, or face o throat tightness o hoarseness 3 Revised: 06 November 2020  o skin rash ? Liver Problems. Tell your healthcare provider right away if you have any of these signs and symptoms of liver problems: loss of appetite, yellowing of your skin and the whites of eyes (jaundice), dark-colored urine, pale colored stools and itchy skin, stomach area (abdominal) pain. ? Resistance to HIV Medicines. If you have untreated HIV infection, PAXLOVID may lead to some HIV medicines not working as well in the future. ? Other possible side effects include: o altered sense of taste o diarrhea o high blood pressure o muscle aches These are not all the possible side effects of PAXLOVID. Not  many people have taken PAXLOVID. Serious and unexpected side effects may happen. PAXLOVID is still being studied, so it is possible that all of the risks are not known at this time.  What other treatment choices are there? Veklury (remdesivir) is FDA-approved for the treatment of mild-to-moderate [id number]-80 in certain adults and children. Talk with your doctor to see if Marijean Heath is appropriate for you. Like PAXLOVID, FDA may also allow for the emergency use of other medicines to treat people with COVID-19. Go to https://price.info/ for information on the emergency use of other medicines that are authorized by FDA to treat people with COVID-19. Your healthcare provider may talk with you about  clinical trials for which you may be eligible. It is your choice to be treated or not to be treated with PAXLOVID. Should you decide not to receive it or for your child not to receive it, it will not change your standard medical care.  What if I am pregnant or breastfeeding? There is no experience treating pregnant women or breastfeeding mothers with PAXLOVID. For a mother and unborn baby, the benefit of taking PAXLOVID may be greater than the risk from the treatment. If you are pregnant, discuss your options and specific situation with your healthcare provider. It is recommended that you use effective barrier contraception or do not have sexual activity while taking PAXLOVID. If you are breastfeeding, discuss your options and specific situation with your healthcare provider. 4 Revised: 06 November 2020   How do I report side effects with PAXLOVID? Contact your healthcare provider if you have any side effects that bother you or do not go away. Report side effects to FDA MedWatch at SmoothHits.hu or call 1-800-FDA1088 or you can report side effects to [employer]. at the contact information provided below. Website Fax number Telephone number www.pfizersafetyreporting.com [phone] How should I store [place]? Store PAXLOVID tablets at room temperature, between 68?F to 77?F (20?C to 25?C). How can I learn more about COVID-19? ? Ask your healthcare provider. ? Visit [email or web address]-johnson.[email or web address] ? Contact your local or state public health department. What is an Emergency Use Authorization (EUA)? The [place] FDA has made PAXLOVID available under an emergency access mechanism called an Emergency Use Authorization (EUA). The EUA is supported by a Education officer, museum and Human Service (HHS) declaration that circumstances exist to justify the emergency use of drugs and biological products during the COVID-19 pandemic. PAXLOVID for the treatment of  mild-to-moderate COVID-19 in adults and children [47 years of age and older weighing at least 21 pounds (64 kg)] with positive results of direct SARS-CoV-2 viral testing, and who are at high risk for progression to severe COVID-19, including hospitalization or death, has not undergone the same type of review as an FDA-approved product. In issuing an EUA under the KWIOX-73 public health emergency, the FDA has determined, among other things, that based on the total amount of scientific evidence available including data from adequate and well-controlled clinical trials, if available, it is reasonable to believe that the product may be effective for diagnosing, treating, or preventing COVID-19, or a serious or life-threatening disease or condition caused by COVID-19; that the known and potential benefits of the product, when used to diagnose, treat, or prevent such disease or condition, outweigh the known and potential risks of such product; and that there are no adequate, approved, and available alternatives. All of these criteria must be met to allow for the product to be used in the treatment of patients during the COVID-19 pandemic.  The EUA for PAXLOVID is in effect for the duration of the COVID-19 declaration justifying emergency use of this product, unless terminated or revoked (after which the products may no longer be used under the EUA). 5 Revised: 06 November 2020     Additional Information For general questions, visit the website or call the telephone number provided below. Website Telephone number www.COVID19oralRx.com [phone] (1-877-C19-PACK) You can also go to www.pfizermedinfo.com or call [phone] for more information. [id number] Revised: 06 November 2020

## 2021-03-11 NOTE — Progress Notes (Signed)
Virtual Visit via Video Note  I connected with Steven Brooks  on 03/11/21 at 12:00 PM EDT by a video enabled telemedicine application and verified that I am speaking with the correct person using two identifiers.  Location patient: home, [place] Location provider:work or home office Persons participating in the virtual visit: patient, provider  I discussed the limitations of evaluation and management by telemedicine and the availability of in person appointments. The patient expressed understanding and agreed to proceed.   HPI:  Acute telemedicine visit for Covid19: -Onset: 2 days ago, tested positive for covid on home test -Symptoms include: congestion, cough, sore throat, HA, body aches, feels tired -Denies: fever, CP, SOB, vomiting, inability to eat/drink/get out of bed -Pertinent past medical history: obesity, asthma, prediabetes -Pertinent medication allergies: No Known Allergies -COVID-19 vaccine status: vaccinated x2 + booster -had labs recently with GFR 95 -reports is NOT taking any medications currently as had not started either of the drugs on his list  ROS: See pertinent positives and negatives per HPI.  Past Medical History:  Diagnosis Date   Asthma    Prediabetes     Past Surgical History:  Procedure Laterality Date   NO PAST SURGERIES       Current Outpatient Medications:    benzonatate (TESSALON PERLES) 100 MG capsule, Take 1 capsule (100 mg total) by mouth 3 (three) times daily as needed., Disp: 20 capsule, Rfl: 0   nirmatrelvir/ritonavir EUA (PAXLOVID) TABS, Take 3 tablets by mouth 2 (two) times daily for 5 days. (Take nirmatrelvir 150 mg two tablets twice daily for 5 days and ritonavir 100 mg one tablet twice daily for 5 days) Patient GFR is 95, Disp: 30 tablet, Rfl: 0   tadalafil (CIALIS) 20 MG tablet, Take 0.5-1 tablets (10-20 mg total) by mouth every other day as needed for erectile dysfunction., Disp: 30 tablet, Rfl: 5   varenicline (CHANTIX PAK) 0.5 MG X 11 & 1 MG X  42 tablet, Take 0.5 mg tab by mouth daily for 3 days, then increase to 0.5 mg tab twice daily for 4 days, then increase to one 1 mg tab twice daily., Disp: 53 tablet, Rfl: 0  EXAM:  VITALS per patient if applicable:  GENERAL: alert, oriented, appears well and in no acute distress  HEENT: atraumatic, conjunttiva clear, no obvious abnormalities on inspection of external nose and ears  NECK: normal movements of the head and neck  LUNGS: on inspection no signs of respiratory distress, breathing rate appears normal, no obvious gross SOB, gasping or wheezing  CV: no obvious cyanosis  MS: moves all visible extremities without noticeable abnormality  PSYCH/NEURO: pleasant and cooperative, no obvious depression or anxiety, speech and thought processing grossly intact  ASSESSMENT AND PLAN:  Discussed the following assessment and plan:  COVID-19   Discussed treatment options, ideal treatment window, potential complications, isolation and precautions for COVID-19.  After lengthy discussion, the patient opted for treatment with Paxlovid due to being higher risk for complications of covid or severe disease and other factors. Discussed EUA status of this drug and the fact that there is preliminary limited knowledge of risks/interactions/side effects per EUA document vs possible benefits and precautions. This information was shared with patient during the visit and also was provided in patient instructions. Also, advised that patient discuss risks/interactions and use with pharmacist/treatment team as well. He is advised on holding off on starting any medications until after finishes the paxlovid. The patient did want a prescription for cough, Tessalon Rx sent.  Other  symptomatic care measures summarized in patient instructions. Work/School slipped offered: provided in patient instructions   Scheduled follow up with PCP offered: he opted for prn follow up Advised to seek prompt in person care if  worsening, new symptoms arise, or if is not improving with treatment. Discussed options for inperson care if PCP office not available. Did let this patient know that I only do telemedicine on Tuesdays and Thursdays for [place]. Advised to schedule follow up visit with PCP or [facility] if any further questions or concerns to avoid delays in care.   I discussed the assessment and treatment plan with the patient. The patient was provided an opportunity to ask questions and all were answered. The patient agreed with the plan and demonstrated an understanding of the instructions.     Terressa Koyanagi, DO

## 2021-03-26 ENCOUNTER — Ambulatory Visit (INDEPENDENT_AMBULATORY_CARE_PROVIDER_SITE_OTHER): Payer: 59 | Admitting: Family Medicine

## 2021-03-26 ENCOUNTER — Other Ambulatory Visit: Payer: Self-pay

## 2021-03-26 ENCOUNTER — Encounter: Payer: Self-pay | Admitting: Family Medicine

## 2021-03-26 VITALS — BP 122/82 | HR 56 | Temp 98.1°F | Ht 72.0 in | Wt 257.0 lb

## 2021-03-26 DIAGNOSIS — Z23 Encounter for immunization: Secondary | ICD-10-CM | POA: Diagnosis not present

## 2021-03-26 DIAGNOSIS — E1165 Type 2 diabetes mellitus with hyperglycemia: Secondary | ICD-10-CM | POA: Diagnosis not present

## 2021-03-26 LAB — MICROALBUMIN / CREATININE URINE RATIO
Creatinine,U: 233.8 mg/dL
Microalb Creat Ratio: 2 mg/g (ref 0.0–30.0)
Microalb, Ur: 4.6 mg/dL — ABNORMAL HIGH (ref 0.0–1.9)

## 2021-03-26 MED ORDER — ROSUVASTATIN CALCIUM 20 MG PO TABS: 20.0000 mg | ORAL_TABLET | Freq: Every day | ORAL | 3 refills | Status: DC

## 2021-03-26 NOTE — Progress Notes (Signed)
Chief Complaint  Patient presents with   discuss lab results    Subjective: Patient is a 60 y.o. male here for lab follow-up.  He is here with his fiance.  The patient had a physical a few weeks ago.  An A1c was drawn that showed a level of 6.7.  Prior to this, he was taking no medications routinely.  He does follow with an eye doctor routinely.  He is not on a statin.  He has never had the pneumonia vaccine.  Diet/exercise improving.  He is not currently checking his sugars.  He has never had a foot exam.  He does not routinely have his urine microalbumin checked.  Past Medical History:  Diagnosis Date   Asthma    Type 2 diabetes mellitus with hyperglycemia (HCC)     Objective: BP 122/82   Pulse (!) 56   Temp 98.1 F (36.7 C) (Oral)   Ht 6' (1.829 m)   Wt 257 lb (116.6 kg)   SpO2 95%   BMI 34.86 kg/m  General: Awake, appears stated age Neuro: Sensation intact to pinprick over both feet, gait is normal Skin: No callus formation, wounds/ulcerations, or erythema on the feet bilaterally Heart: 2+ DP pulses bilaterally Lungs: No accessory muscle use Psych: Age appropriate judgment and insight, normal affect and mood  Assessment and Plan: Type 2 diabetes mellitus with hyperglycemia, without long-term current use of insulin (HCC) - Plan: Microalbumin / creatinine urine ratio, rosuvastatin (CRESTOR) 20 MG tablet, Hepatic function panel, Lipid panel  Need for vaccination against Streptococcus pneumoniae - Plan: Pneumococcal conjugate vaccine 20-valent (Prevnar 20)  Chronic, currently controlled. Urine, PCV20, start statin, counseled on diet and exercise, foot exam benign, does not need to check sugars at this time.  Goal is less than 7.  Unless he needs me, I will see him in 6 months.  Recheck lipid panel and hepatic function panel in 6 weeks since we are starting Crestor. The patient and his fiance voiced understanding and agreement to the plan.  I spent 35 minutes with the  patient and his fiance answering questions and discussing the above in addition to reviewing his chart on the same day of the visit.  Jilda Roche [place], DO 03/26/21  10:48 AM

## 2021-03-26 NOTE — Patient Instructions (Addendum)
If you do not hear anything about your referral in the next 1-2 weeks, call our office and ask for an update.  Keep the diet clean and stay active.  Stay hydrated.  Let us know if you need anything.

## 2021-04-05 ENCOUNTER — Ambulatory Visit (HOSPITAL_BASED_OUTPATIENT_CLINIC_OR_DEPARTMENT_OTHER)
Admission: RE | Admit: 2021-04-05 | Discharge: 2021-04-05 | Disposition: A | Discharge status: Home / Self Care | Payer: 59 | Source: Ambulatory Visit | Attending: Family Medicine | Admitting: Family Medicine

## 2021-04-05 ENCOUNTER — Other Ambulatory Visit: Payer: Self-pay

## 2021-04-05 DIAGNOSIS — Z122 Encounter for screening for malignant neoplasm of respiratory organs: Secondary | ICD-10-CM | POA: Diagnosis not present

## 2021-05-07 ENCOUNTER — Other Ambulatory Visit: Payer: Self-pay | Admitting: Family Medicine

## 2021-05-07 ENCOUNTER — Other Ambulatory Visit: Payer: Self-pay

## 2021-05-07 ENCOUNTER — Other Ambulatory Visit (INDEPENDENT_AMBULATORY_CARE_PROVIDER_SITE_OTHER): Payer: 59

## 2021-05-07 DIAGNOSIS — E1165 Type 2 diabetes mellitus with hyperglycemia: Secondary | ICD-10-CM | POA: Diagnosis not present

## 2021-05-07 DIAGNOSIS — R7401 Elevation of levels of liver transaminase levels: Secondary | ICD-10-CM

## 2021-05-07 LAB — LIPID PANEL
Cholesterol: 84 mg/dL (ref 0–200)
HDL: 36.1 mg/dL — ABNORMAL LOW (ref >39.00)
LDL Cholesterol: 33 mg/dL (ref 0–99)
NonHDL: 47.87
Total CHOL/HDL Ratio: 2
Triglycerides: 74 mg/dL (ref 0.0–149.0)
VLDL: 14.8 mg/dL (ref 0.0–40.0)

## 2021-05-07 LAB — HEPATIC FUNCTION PANEL
ALT: 59 U/L — ABNORMAL HIGH (ref 0–53)
AST: 36 U/L (ref 0–37)
Albumin: 4.3 g/dL (ref 3.5–5.2)
Alkaline Phosphatase: 53 U/L (ref 39–117)
Bilirubin, Direct: 0.2 mg/dL (ref 0.0–0.3)
Total Bilirubin: 0.7 mg/dL (ref 0.2–1.2)
Total Protein: 7.2 g/dL (ref 6.0–8.3)

## 2021-05-11 ENCOUNTER — Telehealth (HOSPITAL_BASED_OUTPATIENT_CLINIC_OR_DEPARTMENT_OTHER): Payer: Self-pay

## 2021-05-31 ENCOUNTER — Other Ambulatory Visit (HOSPITAL_BASED_OUTPATIENT_CLINIC_OR_DEPARTMENT_OTHER): Payer: Self-pay

## 2021-05-31 ENCOUNTER — Ambulatory Visit (HOSPITAL_BASED_OUTPATIENT_CLINIC_OR_DEPARTMENT_OTHER): Admission: RE | Admit: 2021-05-31 | Payer: 59 | Source: Ambulatory Visit

## 2021-08-13 ENCOUNTER — Encounter: Payer: Self-pay | Admitting: Family Medicine

## 2021-08-13 ENCOUNTER — Ambulatory Visit (INDEPENDENT_AMBULATORY_CARE_PROVIDER_SITE_OTHER): Payer: 59 | Admitting: *Deleted

## 2021-08-13 DIAGNOSIS — Z23 Encounter for immunization: Secondary | ICD-10-CM

## 2021-08-13 NOTE — Progress Notes (Signed)
Patient here for flu vaccine.  Vaccine given in left deltoid and patient tolerated well. 

## 2021-09-27 ENCOUNTER — Ambulatory Visit: Payer: 59 | Admitting: Family Medicine

## 2021-12-15 ENCOUNTER — Telehealth: Payer: Self-pay | Admitting: Family Medicine

## 2021-12-15 NOTE — Telephone Encounter (Signed)
LVM to call back and schedule appt.

## 2021-12-15 NOTE — Telephone Encounter (Signed)
He needs an appointment

## 2021-12-15 NOTE — Telephone Encounter (Signed)
Patient would like for Dr. Carmelia Roller to order him some sea sickness patches since he is going on a cruise soon. Please advise.  ? [id number] - [place], [place] - [address]  ?[address], [place] [address]  ?Phone:  [phone]  Fax:  [phone]  ?

## 2021-12-28 ENCOUNTER — Ambulatory Visit: Payer: 59 | Admitting: Family Medicine

## 2021-12-31 ENCOUNTER — Ambulatory Visit: Payer: 59 | Admitting: Family

## 2021-12-31 ENCOUNTER — Encounter: Payer: Self-pay | Admitting: Family

## 2021-12-31 VITALS — BP 142/80 | HR 65 | Temp 98.2°F | Ht 72.0 in | Wt 264.0 lb

## 2021-12-31 DIAGNOSIS — K1121 Acute sialoadenitis: Secondary | ICD-10-CM

## 2021-12-31 MED ORDER — AMOXICILLIN-POT CLAVULANATE 875-125 MG PO TABS
1.0000 | ORAL_TABLET | Freq: Two times a day (BID) | ORAL | 0 refills | Status: AC
Start: 2021-12-31 — End: 2022-01-10

## 2021-12-31 NOTE — Patient Instructions (Signed)
Parotitis ? ?Parotitis means that you have irritation and swelling (inflammation) in one or both of your parotid glands. These glands make saliva. They are found on each side of your face, below and in front of your earlobes. You may or may not have pain with this condition. ?What are the causes? ?This condition may be caused by: ?Infections from germs (bacteria or viruses). ?Something blocking the flow of saliva through the parotid glands. This can be a stone, scar tissue, or a tumor. ?Diseases that cause your body's defense system (immune system) to attack healthy cells in your salivary glands. These are called autoimmune diseases. ?What increases the risk? ?Being 50 years old or older. ?Not drinking enough fluids (being dehydrated). ?Drinking too much alcohol. ?Having: ?A dry mouth. ?Diabetes. ?Gout. ?A long-term illness. ?Not taking good care of your mouth and teeth (poor oral hygiene). ?Having had radiation treatments to the head and neck. ?Taking certain medicines. ?What are the signs or symptoms? ?Swelling under and in front of the ear. This may get worse after you eat. ?Pain and tenderness over the parotid gland. This may get worse after you eat. ?Redness and warmth of the skin over the parotid gland. ?Fever or chills. ?Pus coming from the ducts inside the mouth. ?Dry mouth. ?A bad taste in the mouth. ?How is this treated? ?Treatment depends on the cause. It may include: ?Antibiotic medicine for an infection from bacteria. ?NSAIDs, such as ibuprofen, to treat pain and swelling. ?Drinking more fluids. ?Removing a stone or obstruction. ?Treating a disease that is causing parotitis. ?Surgery to drain an infection, remove a growth, or remove the whole gland. ?Treatment may not be needed if the swelling goes away with home care. ?Follow these instructions at home: ?Medicines ? ?Take over-the-counter and prescription medicines only as told by your doctor. ?If you were prescribed an antibiotic medicine, take it as  told by your doctor. Do not stop taking it even if you start to feel better. ?Managing pain and swelling ?If told, put heat on the affected area. Do this as often as told by your doctor. Use the heat source that your doctor recommends, such as a moist heat pack or a heating pad. ?Place a towel between your skin and the heat source. ?Leave the heat on for 20-30 minutes. ?Take off the heat if your skin turns bright red. This is very important. If you cannot feel pain, heat, or cold, you have a greater risk of getting burned. ?Gargle with salt water 3-4 times a day or as needed. To make salt water, dissolve ?-1 tsp (3-6 g) of salt in 1 cup (237 mL) of warm water. ?Gently rub your parotid glands as told by your doctor. ?General instructions ? ?Drink enough fluid to keep your pee (urine) pale yellow. ?Keep your mouth clean and moist. ?Suck on sour candy. This may help to: ?Make your mouth less dry. ?Make more saliva. ?Take good care of your mouth: ?Brush your teeth at least two times a day. ?Floss your teeth every day. ?See your dentist regularly. ?Do not smoke or use any products that contain nicotine or tobacco. If you need help quitting, ask your doctor. ?Do not drink alcohol. ?Keep all follow-up visits. ?Contact a doctor if: ?You have a fever or chills. ?You have new symptoms. ?Your symptoms get worse. ?Your symptoms do not get better with treatment. ?Get help right away if: ?You have trouble breathing or swallowing. ?These symptoms may be an emergency. Get help right away. Call your   local emergency services (911 in the [place] ?Do not wait to see if the symptoms will go away. ?Do not drive yourself to the hospital. ?Summary ?Parotitis means that you have irritation and swelling (inflammation) in one or both of your parotid glands. ?Symptoms include pain and swelling under and in front of the ear. ?Treatment for parotitis depends on the cause. In some cases, the condition may go away on its own with home care. ?You  should drink plenty of fluids, take good care of your mouth, and do not use products that contain nicotine or tobacco. ?This information is not intended to replace advice given to you by your health care provider. Make sure you discuss any questions you have with your health care provider. ?Document Revised: 12/18/2020 Document Reviewed: 12/18/2020 ?Elsevier Patient Education ? 2023 Elsevier Inc. ? ?

## 2021-12-31 NOTE — Progress Notes (Signed)
?Steven Brooks is a 61 y.o. male with the following history as recorded in EpicCare:  ?Patient Active Problem List  ? Diagnosis Date Noted  ? Prediabetes   ? Tobacco abuse 10/05/2018  ? Other fatigue 09/26/2017  ? Left knee pain 09/12/2017  ? Nicotine dependence with current use 09/12/2017  ?  ?Current Outpatient Medications  ?Medication Sig Dispense Refill  ? amoxicillin-clavulanate (AUGMENTIN) 875-125 MG tablet Take 1 tablet by mouth 2 (two) times daily for 10 days. 20 tablet 0  ? rosuvastatin (CRESTOR) 20 MG tablet Take 20 mg by mouth daily.    ? tadalafil (CIALIS) 20 MG tablet Take 0.5-1 tablets (10-20 mg total) by mouth every other day as needed for erectile dysfunction. (Patient not taking: Reported on 12/31/2021) 30 tablet 5  ? ?No current facility-administered medications for this visit.  ?  ?Allergies: Patient has no known allergies.  ?Past Medical History:  ?Diagnosis Date  ? Asthma   ? Type 2 diabetes mellitus with hyperglycemia (HCC)   ?  ?Past Surgical History:  ?Procedure Laterality Date  ? NO PAST SURGERIES    ?  ?Family History  ?Problem Relation Age of Onset  ? Arthritis Mother   ? Asthma Mother   ? Diabetes Mother   ? Heart attack Mother   ? Heart disease Mother   ? Hypertension Mother   ? Arthritis Father   ? Cancer Father   ? Depression Father   ? Diabetes Father   ? Hearing loss Father   ? Heart attack Father   ? Heart disease Father   ? Hypertension Father   ? Hearing loss Brother   ? Depression Brother   ? Hearing loss Sister   ? Hearing loss Sister   ? Hearing loss Brother   ? Kidney disease Brother   ? Hearing loss Brother   ?  ?Social History  ? ?Tobacco Use  ? Smoking status: Every Day  ?  Packs/day: 0.75  ?  Years: 41.00  ?  Pack years: 30.75  ?  Types: Cigarettes  ?  Start date: 12/05/1979  ? Smokeless tobacco: Never  ?Substance Use Topics  ? Alcohol use: Yes  ?  Comment: Very Rarely  ?  ?Subjective:  ?2 day history of "swollen place" on right side of jaw; no difficulty breathing or  swallowing; no fever; does feel that hearing in right ear is muffled;  ? ? ?Objective:  ?Vitals:  ? 12/31/21 1445  ?BP: (!) 142/80  ?Pulse: 65  ?Temp: 98.2 ?F (36.8 ?C)  ?TempSrc: Oral  ?SpO2: 93%  ?Weight: 264 lb (119.7 kg)  ?Height: 6' (1.829 m)  ?  ?General: Well developed, well nourished, in no acute distress  ?Skin : Warm and dry.  ?Head: Normocephalic and atraumatic  ?Eyes: Sclera and conjunctiva clear; pupils round and reactive to light; extraocular movements intact  ?Ears: External normal; canals clear; tympanic membranes normal  ?Oropharynx: Pink, supple. No suspicious lesions  ?Neck: Supple without thyromegaly, enlarged parotid gland on right side-not tender; ?Lungs: Respirations unlabored;  ?Neurologic: Alert and oriented; speech intact; face symmetrical; moves all extremities well; CNII-XII intact without focal deficit  ? ?Assessment:  ?1. Acute parotitis   ?  ?Plan:  ?Encouraged to apply heat and try sucking on lemon/ sour candy to promote salivation; Rx for Augmentin- take as directed; follow up worse, no better and will need to refer to ENT;  ? ?No follow-ups on file.  ?No orders of the defined types were placed in this  encounter. ?  ?Requested Prescriptions  ? ?Signed Prescriptions Disp Refills  ? amoxicillin-clavulanate (AUGMENTIN) 875-125 MG tablet 20 tablet 0  ?  Sig: Take 1 tablet by mouth 2 (two) times daily for 10 days.  ?  ? ?

## 2022-02-28 ENCOUNTER — Encounter: Payer: Self-pay | Admitting: Family Medicine

## 2022-03-02 ENCOUNTER — Encounter: Payer: Self-pay | Admitting: Family Medicine

## 2022-03-02 ENCOUNTER — Ambulatory Visit: Payer: 59 | Admitting: Family Medicine

## 2022-03-02 VITALS — BP 122/70 | HR 70 | Temp 98.1°F | Ht 73.0 in | Wt 261.5 lb

## 2022-03-02 DIAGNOSIS — K219 Gastro-esophageal reflux disease without esophagitis: Secondary | ICD-10-CM

## 2022-03-02 DIAGNOSIS — Z1211 Encounter for screening for malignant neoplasm of colon: Secondary | ICD-10-CM

## 2022-03-02 MED ORDER — PANTOPRAZOLE SODIUM 40 MG PO TBEC: 40.0000 mg | DELAYED_RELEASE_TABLET | Freq: Every day | ORAL | 1 refills | Status: DC

## 2022-03-02 NOTE — Progress Notes (Signed)
Chief Complaint  Patient presents with   Heartburn    Dry mouth Bad breath    Subjective: Patient is a 61 y.o. male here for heartburn.  6 d of dry mouth and heartburn. Bothersome during day and night as well. No dietary changes, food flares heartburn s/s's. Drinking more fluids. Tried Pepcid helps temporarily until he eats again and things flare. No bleeding, abd pain, dysphagia, unintentional weight loss, urinary complaints, new joint pain.  He has not been taking any antiallergy medication recently.  Past Medical History:  Diagnosis Date   Asthma    Type 2 diabetes mellitus with hyperglycemia (HCC)     Objective: BP 122/70   Pulse 70   Temp 98.1 F (36.7 C) (Oral)   Ht 6\' 1"  (1.854 m)   Wt 261 lb 8 oz (118.6 kg)   SpO2 94%   BMI 34.50 kg/m  General: Awake, appears stated age Heart: RRR, no LE edema Lungs: CTAB, no rales, wheezes or rhonchi. No accessory muscle use Abd: Bowel sounds present, soft, nontender, nondistended Mouth: MMM, appropriate pooled saliva on the floor of his mouth Psych: Age appropriate judgment and insight, normal affect and mood  Assessment and Plan: Gastroesophageal reflux disease, unspecified whether esophagitis present - Plan: pantoprazole (PROTONIX) 40 MG tablet  Screen for colon cancer - Plan: Ambulatory referral to Gastroenterology  Chronic issue that is not currently controlled.  Add Protonix 40 mg daily, 30 minutes before food it should be taken.  Continue Pepcid as needed for breakthrough symptoms.  We discussed reflux precautions and it was written down in paperwork.  Follow-up in 1 month to recheck this and for physical. Refer to GI for colon cancer screening. The patient voiced understanding and agreement to the plan.  [place], DO 03/02/22  11:56 AM

## 2022-03-02 NOTE — Patient Instructions (Addendum)
Consider Biotene mouthwash.   The only lifestyle changes that have data behind them are weight loss for the overweight/obese and elevating the head of the bed. Finding out which foods/positions are triggers is important.  If you do not hear anything about your referral in the next 1-2 weeks, call our office and ask for an update.  Let us know if you need anything.

## 2022-04-08 ENCOUNTER — Ambulatory Visit (INDEPENDENT_AMBULATORY_CARE_PROVIDER_SITE_OTHER): Payer: 59 | Admitting: Family Medicine

## 2022-04-08 ENCOUNTER — Encounter: Payer: Self-pay | Admitting: Family Medicine

## 2022-04-08 VITALS — BP 124/72 | HR 66 | Temp 98.6°F | Resp 16 | Ht 72.0 in | Wt 263.0 lb

## 2022-04-08 DIAGNOSIS — L989 Disorder of the skin and subcutaneous tissue, unspecified: Secondary | ICD-10-CM | POA: Diagnosis not present

## 2022-04-08 DIAGNOSIS — R453 Demoralization and apathy: Secondary | ICD-10-CM

## 2022-04-08 DIAGNOSIS — Z125 Encounter for screening for malignant neoplasm of prostate: Secondary | ICD-10-CM | POA: Diagnosis not present

## 2022-04-08 DIAGNOSIS — E1165 Type 2 diabetes mellitus with hyperglycemia: Secondary | ICD-10-CM

## 2022-04-08 DIAGNOSIS — Z0000 Encounter for general adult medical examination without abnormal findings: Secondary | ICD-10-CM

## 2022-04-08 LAB — CBC
HCT: 47.2 % (ref 39.0–52.0)
Hemoglobin: 15.7 g/dL (ref 13.0–17.0)
MCHC: 33.3 g/dL (ref 30.0–36.0)
MCV: 89.4 fl (ref 78.0–100.0)
Platelets: 263 10*3/uL (ref 150.0–400.0)
RBC: 5.28 Mil/uL (ref 4.22–5.81)
RDW: 13.2 % (ref 11.5–15.5)
WBC: 7.7 10*3/uL (ref 4.0–10.5)

## 2022-04-08 LAB — COMPREHENSIVE METABOLIC PANEL
ALT: 82 U/L — ABNORMAL HIGH (ref 0–53)
AST: 64 U/L — ABNORMAL HIGH (ref 0–37)
Albumin: 4.3 g/dL (ref 3.5–5.2)
Alkaline Phosphatase: 50 U/L (ref 39–117)
BUN: 21 mg/dL (ref 6–23)
CO2: 28 mEq/L (ref 19–32)
Calcium: 9 mg/dL (ref 8.4–10.5)
Chloride: 98 mEq/L (ref 96–112)
Creatinine, Ser: 0.89 mg/dL (ref 0.40–1.50)
GFR: 93.91 mL/min (ref >60.00)
Glucose, Bld: 271 mg/dL — ABNORMAL HIGH (ref 70–99)
Potassium: 4.5 mEq/L (ref 3.5–5.1)
Sodium: 134 mEq/L — ABNORMAL LOW (ref 135–145)
Total Bilirubin: 0.7 mg/dL (ref 0.2–1.2)
Total Protein: 7.1 g/dL (ref 6.0–8.3)

## 2022-04-08 LAB — MICROALBUMIN / CREATININE URINE RATIO
Creatinine,U: 225.7 mg/dL
Microalb Creat Ratio: 5.6 mg/g (ref 0.0–30.0)
Microalb, Ur: 12.6 mg/dL — ABNORMAL HIGH (ref 0.0–1.9)

## 2022-04-08 LAB — LIPID PANEL
Cholesterol: 84 mg/dL (ref 0–200)
HDL: 32.5 mg/dL — ABNORMAL LOW (ref >39.00)
LDL Cholesterol: 27 mg/dL (ref 0–99)
NonHDL: 51.6
Total CHOL/HDL Ratio: 3
Triglycerides: 122 mg/dL (ref 0.0–149.0)
VLDL: 24.4 mg/dL (ref 0.0–40.0)

## 2022-04-08 LAB — TSH: TSH: 1.89 u[IU]/mL (ref 0.35–5.50)

## 2022-04-08 LAB — HEMOGLOBIN A1C: Hgb A1c MFr Bld: 9.2 % — ABNORMAL HIGH (ref 4.6–6.5)

## 2022-04-08 LAB — PSA: PSA: 0.48 ng/mL (ref 0.10–4.00)

## 2022-04-08 NOTE — Patient Instructions (Addendum)
Give [place] 2-3 business days to get the results of your labs back.   Keep the diet clean and stay active.  Aim to do some physical exertion for 150 minutes per week. This is typically divided into 5 days per week, 30 minutes per day. The activity should be enough to get your heart rate up. Anything is better than nothing if you have time constraints.  I recommend getting the flu shot in mid October. This suggestion would change if the CDC comes out with a different recommendation.   Please get me a copy of your advanced directive form at your convenience.   Please call the gastroenterology team regarding your colonoscopy: [phone]  Let us know if you need anything.  Please consider counseling. Contact [phone] to schedule an appointment or inquire about cost/insurance coverage.  [facility] located at [address], [place], [place].  Phone number = [phone].  Dr. Regan Lemming - Adult Psychiatry.    [facility] located at [address] [place], [place], [place]. Phone number = [phone].   [facility] located at [address] [place], [place].  Phone number = [phone].   [facility] located at [address] [place], [place], [place].  Phone number = [phone].

## 2022-04-08 NOTE — Progress Notes (Signed)
CC: Physical  Well Male Steven Brooks is here for a complete physical.   His last physical was >1 year ago.  Current diet: in general, a "healthy" diet.  Current exercise: cycling, elliptical, wt resistance exercise Weight trend: stable Fatigue out of ordinary? Yes, over past 2 weeks Seat belt? Yes.   Advanced directive? No  Health maintenance Shingrix- No Colonoscopy- No Tetanus- Yes HIV- Yes Hep C- Yes  Fatigue 1.5 weeks of fatigue, difficulty sleeping, depressed mood, overeating, little interest in things and feeling bad about himself. No trigger for this, no hx of GAD or MDD. No SI or HI. No self medication. Has not tried anything thus far. Not following w counselor/psychologist.    Past Medical History:  Diagnosis Date   Asthma    Type 2 diabetes mellitus with hyperglycemia (HCC)     Past Surgical History:  Procedure Laterality Date   NO PAST SURGERIES      Medications  Current Outpatient Medications on File Prior to Visit  Medication Sig Dispense Refill   pantoprazole (PROTONIX) 40 MG tablet Take 1 tablet (40 mg total) by mouth daily. 30 tablet 1   rosuvastatin (CRESTOR) 20 MG tablet Take 20 mg by mouth daily.      Allergies No Known Allergies  Family History Family History  Problem Relation Age of Onset   Arthritis Mother    Asthma Mother    Diabetes Mother    Heart attack Mother    Heart disease Mother    Hypertension Mother    Arthritis Father    Cancer Father    Depression Father    Diabetes Father    Hearing loss Father    Heart attack Father    Heart disease Father    Hypertension Father    Hearing loss Brother    Depression Brother    Hearing loss Sister    Hearing loss Sister    Hearing loss Brother    Kidney disease Brother    Hearing loss Brother     Review of Systems: Constitutional:  no fevers Eye:  no recent significant change in vision Ear/Nose/Mouth/Throat:  Ears:  no hearing loss Nose/Mouth/Throat:  no complaints of nasal  congestion, no sore throat Cardiovascular:  no chest pain Respiratory:  no shortness of breath Gastrointestinal:  no change in bowel habits GU:  Male: negative for dysuria, frequency Musculoskeletal/Extremities:  no joint pain Integumentary (Skin/Breast):  no abnormal skin lesions reported Neurologic:  no headaches Endocrine: No unexpected weight changes Hematologic/Lymphatic:  no abnormal bleeding  Exam BP 124/72 (BP Location: Right Arm, Patient Position: Sitting, Cuff Size: Large)   Pulse 66   Temp 98.6 F (37 C) (Oral)   Resp 16   Ht 6' (1.829 m)   Wt 263 lb (119.3 kg)   SpO2 94%   BMI 35.67 kg/m  General:  well developed, well nourished, in no apparent distress Skin:  no significant moles, warts, or growths Head:  no masses, lesions, or tenderness Eyes:  pupils equal and round, sclera anicteric without injection Ears:  canals without lesions, TMs shiny without retraction, no obvious effusion, no erythema Nose:  nares patent, septum midline, mucosa normal Throat/Pharynx:  lips and gingiva without lesion; tongue and uvula midline; non-inflamed pharynx; no exudates or postnasal drainage Neck: neck supple without adenopathy, thyromegaly, or masses Cardiac: RRR, no bruits, no LE edema Lungs:  clear to auscultation, breath sounds equal bilaterally, no respiratory distress Abdomen: BS+, soft, non-tender, non-distended, no masses or organomegaly noted Rectal: Deferred  Musculoskeletal:  symmetrical muscle groups noted without atrophy or deformity Neuro:  gait normal; deep tendon reflexes normal and symmetric Psych: well oriented with normal range of affect and appropriate judgment/insight  Assessment and Plan  Well adult exam - Plan: CBC, Comprehensive metabolic panel, Lipid panel  Screening for prostate cancer - Plan: PSA  Type 2 diabetes mellitus with hyperglycemia, without long-term current use of insulin (HCC) - Plan: Hemoglobin A1c, Microalbumin / creatinine urine  ratio  Skin lesion - Plan: Ambulatory referral to Dermatology  Apathy - Plan: TSH   Well 61 y.o. male. Counseled on diet and exercise. Counseled on risks and benefits of prostate cancer screening with PSA. The patient agrees to undergo testing. Advanced directive form provided today.  CCS: referral placed 1 mo ago, contact info provided today. Apathy/depressed mood: Offered med which he politely declined. Counseling info provided, counseled on benefit of exercise w this. Prefers counseling for now.  Immunizations, labs, and further orders as above. Follow up in 3-6 mo pending above. The patient voiced understanding and agreement to the plan.  Jilda Roche [place], DO 04/08/22 1:15 PM

## 2022-04-11 ENCOUNTER — Other Ambulatory Visit: Payer: Self-pay | Admitting: Family Medicine

## 2022-04-11 DIAGNOSIS — R7401 Elevation of levels of liver transaminase levels: Secondary | ICD-10-CM

## 2022-04-11 DIAGNOSIS — R7989 Other specified abnormal findings of blood chemistry: Secondary | ICD-10-CM

## 2022-04-11 MED ORDER — METFORMIN HCL 500 MG PO TABS: ORAL_TABLET | ORAL | 1 refills | Status: DC

## 2022-04-15 ENCOUNTER — Ambulatory Visit (HOSPITAL_BASED_OUTPATIENT_CLINIC_OR_DEPARTMENT_OTHER)
Admission: RE | Admit: 2022-04-15 | Discharge: 2022-04-15 | Disposition: A | Discharge status: Home / Self Care | Payer: 59 | Source: Ambulatory Visit | Attending: Family Medicine | Admitting: Family Medicine

## 2022-04-15 DIAGNOSIS — R7989 Other specified abnormal findings of blood chemistry: Secondary | ICD-10-CM | POA: Diagnosis present

## 2022-04-20 ENCOUNTER — Other Ambulatory Visit: Payer: Self-pay | Admitting: Family Medicine

## 2022-05-16 ENCOUNTER — Encounter: Payer: Self-pay | Admitting: Family Medicine

## 2022-05-23 ENCOUNTER — Ambulatory Visit: Payer: 59 | Admitting: Family Medicine

## 2022-05-23 ENCOUNTER — Encounter: Payer: Self-pay | Admitting: Family Medicine

## 2022-05-23 ENCOUNTER — Other Ambulatory Visit: Payer: Self-pay | Admitting: Family Medicine

## 2022-05-23 VITALS — BP 119/71 | HR 66 | Temp 98.4°F | Ht 72.0 in | Wt 259.1 lb

## 2022-05-23 DIAGNOSIS — Z23 Encounter for immunization: Secondary | ICD-10-CM | POA: Diagnosis not present

## 2022-05-23 DIAGNOSIS — E1165 Type 2 diabetes mellitus with hyperglycemia: Secondary | ICD-10-CM | POA: Diagnosis not present

## 2022-05-23 LAB — BASIC METABOLIC PANEL
BUN: 15 mg/dL (ref 6–23)
CO2: 27 mEq/L (ref 19–32)
Calcium: 9.5 mg/dL (ref 8.4–10.5)
Chloride: 101 mEq/L (ref 96–112)
Creatinine, Ser: 0.97 mg/dL (ref 0.40–1.50)
GFR: 85.48 mL/min (ref >60.00)
Glucose, Bld: 178 mg/dL — ABNORMAL HIGH (ref 70–99)
Potassium: 5.1 mEq/L (ref 3.5–5.1)
Sodium: 137 mEq/L (ref 135–145)

## 2022-05-23 MED ORDER — ROSUVASTATIN CALCIUM 20 MG PO TABS: 20.0000 mg | ORAL_TABLET | Freq: Every day | ORAL | 3 refills | Status: DC

## 2022-05-23 MED ORDER — METFORMIN HCL 500 MG PO TABS: 1000.0000 mg | ORAL_TABLET | Freq: Two times a day (BID) | ORAL | 2 refills | Status: DC

## 2022-05-23 MED ORDER — EMPAGLIFLOZIN 10 MG PO TABS: 10.0000 mg | ORAL_TABLET | Freq: Every day | ORAL | 2 refills | Status: DC

## 2022-05-23 NOTE — Progress Notes (Signed)
Chief Complaint  Patient presents with   Results    Subjective: Patient is a 61 y.o. male here for DM f/u.  He is here with his spouse.  Patiently recently started on metformin uptitrating to 1000 mg twice daily.  He reports compliance and no adverse effects.  He is also compliant with his Crestor.  Diet is improving.  He has lost around 4 pounds.  He does not check his sugars at home.  He has received his pneumonia vaccine.  Past Medical History:  Diagnosis Date   Asthma    Type 2 diabetes mellitus with hyperglycemia (HCC)     Objective: BP 119/71 (BP Location: Left Arm, Patient Position: Sitting, Cuff Size: Normal)   Pulse 66   Temp 98.4 F (36.9 C) (Oral)   Ht 6' (1.829 m)   Wt 259 lb 2 oz (117.5 kg)   SpO2 94%   BMI 35.14 kg/m  General: Awake, appears stated age Heart: RRR, no LE edema, no bruits Lungs: CTAB, no rales, wheezes or rhonchi. No accessory muscle use Psych: Age appropriate judgment and insight, normal affect and mood  Assessment and Plan: Type 2 diabetes mellitus with hyperglycemia, without long-term current use of insulin (HCC) - Plan: Basic metabolic panel, rosuvastatin (CRESTOR) 20 MG tablet  Need for influenza vaccination - Plan: Flu Vaccine QUAD 6+ mos PF IM (Fluarix Quad PF)  Chronic, probably not controlled.  Check BMP today, if not controlled we will add Farxiga 10 mg daily to his metformin 1000 mg twice daily.  Follow-up pending the above.  Encouraged him to consider checking his sugars at home.  He will contact his insurance company to find out which meter they will cover and he will let us know on MyChart.  Flu shot today. The patient and his spouse voiced understanding and agreement to the plan.  [place], DO 05/23/22  11:20 AM

## 2022-05-23 NOTE — Patient Instructions (Signed)
If you are interested in checking your sugars, call your insurance company to find out what glucose meter they will cover.   Keep the diet clean and stay active.  Strong work with your weight loss.   Let us know if you need anything.

## 2022-05-24 ENCOUNTER — Ambulatory Visit: Payer: 59 | Admitting: Family Medicine

## 2022-05-26 ENCOUNTER — Encounter: Payer: Self-pay | Admitting: Gastroenterology

## 2022-06-24 ENCOUNTER — Ambulatory Visit (AMBULATORY_SURGERY_CENTER): Payer: Self-pay

## 2022-06-24 VITALS — Ht 72.0 in | Wt 247.0 lb

## 2022-06-24 DIAGNOSIS — Z1211 Encounter for screening for malignant neoplasm of colon: Secondary | ICD-10-CM

## 2022-06-24 MED ORDER — NA SULFATE-K SULFATE-MG SULF 17.5-3.13-1.6 GM/177ML PO SOLN: 1.0000 | Freq: Once | ORAL | 0 refills | Status: AC

## 2022-06-24 NOTE — Progress Notes (Signed)

## 2022-07-08 ENCOUNTER — Encounter: Payer: Self-pay | Admitting: Gastroenterology

## 2022-07-08 ENCOUNTER — Encounter: Payer: Self-pay | Admitting: Family Medicine

## 2022-07-12 IMAGING — CT CT CHEST LUNG CANCER SCREENING LOW DOSE W/O CM
1 series · 10 of 10 positions shown, 13 images · non-contrast
Comparison: None.

CLINICAL DATA: Current smoker, 23 pack-year history.

EXAM:
CT CHEST WITHOUT CONTRAST LOW-DOSE FOR LUNG CANCER SCREENING
TECHNIQUE: Multidetector CT imaging of the chest was performed following the
standard protocol without IV contrast.

[ct lung segmentation data · axial · 0.72mm/px · z∈[-320,-320]mm · 10 of 300 frames shown]
[frame 1/300  mediastinal]
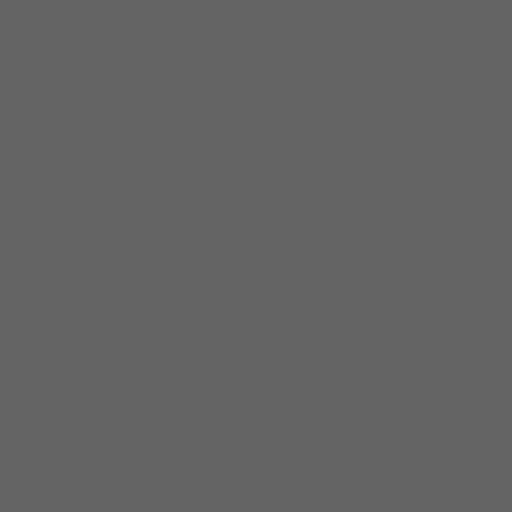
[frame 1/300  lung]
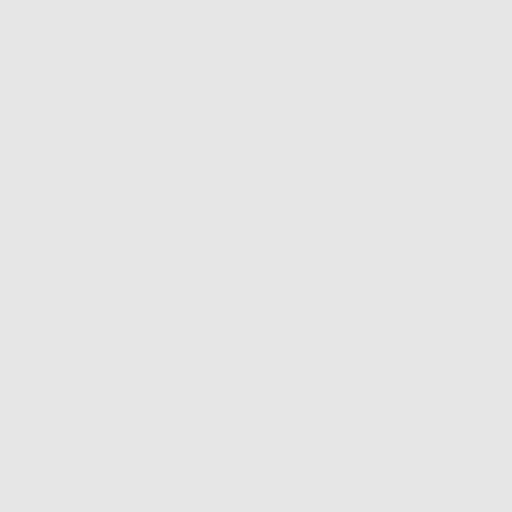
[frame 34/300  lung]
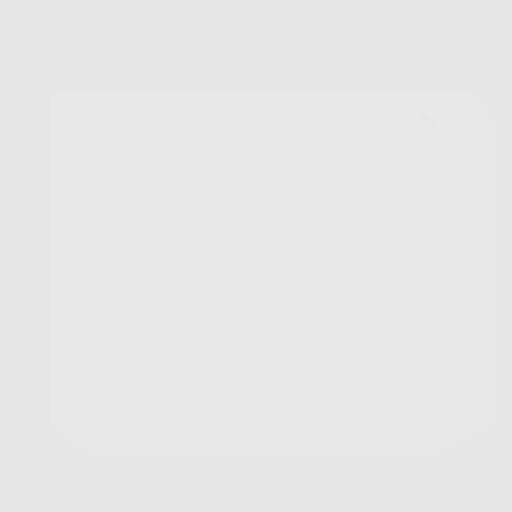
[frame 67/300  lung]
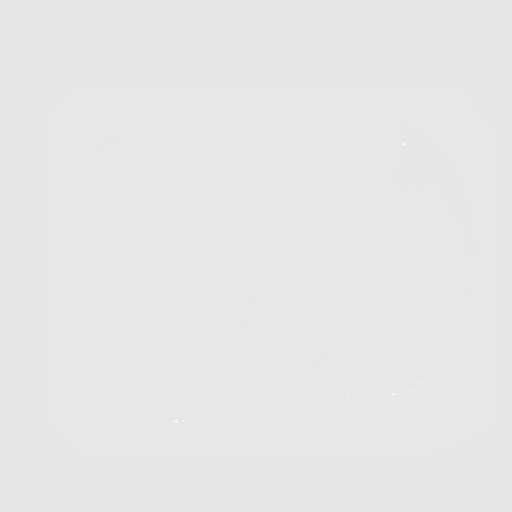
[frame 100/300  lung]
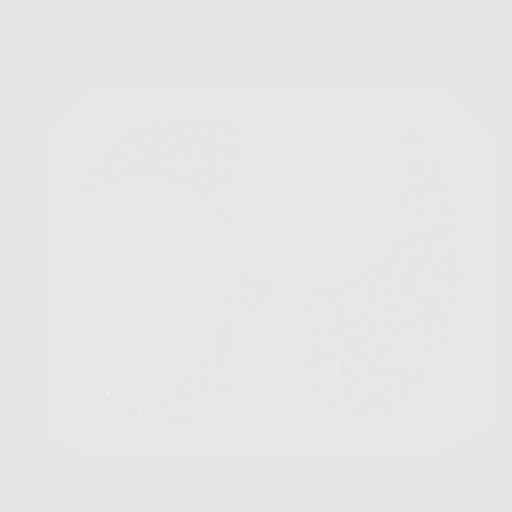
[frame 133/300  mediastinal]
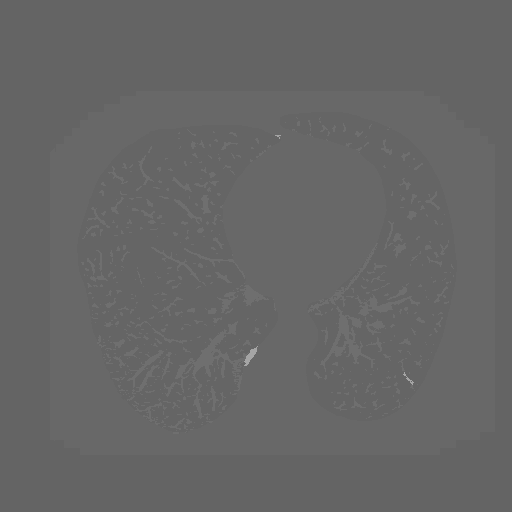
[frame 133/300  lung]
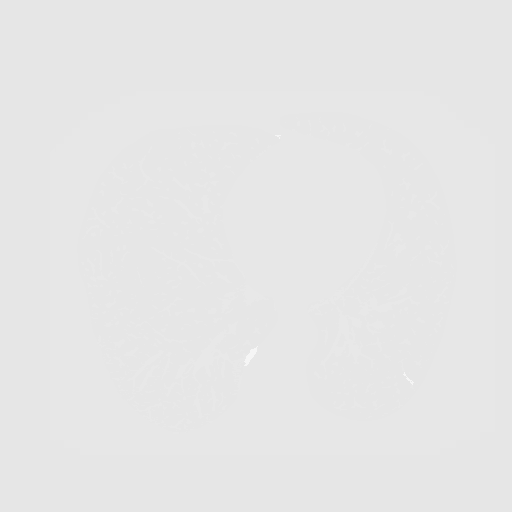
[frame 167/300  lung]
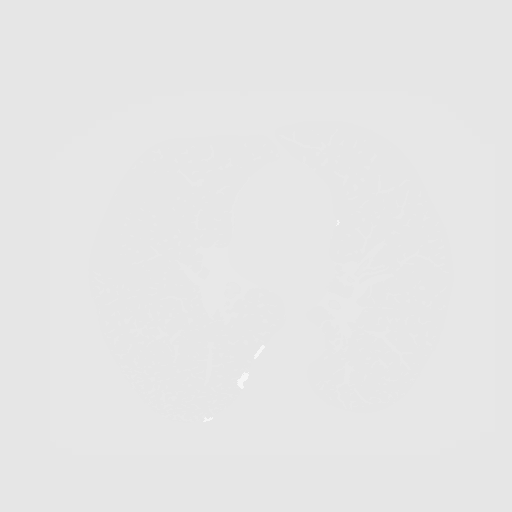
[frame 200/300  lung]
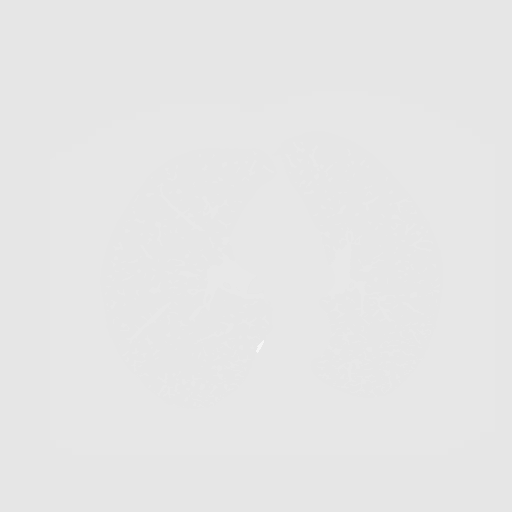
[frame 233/300  lung]
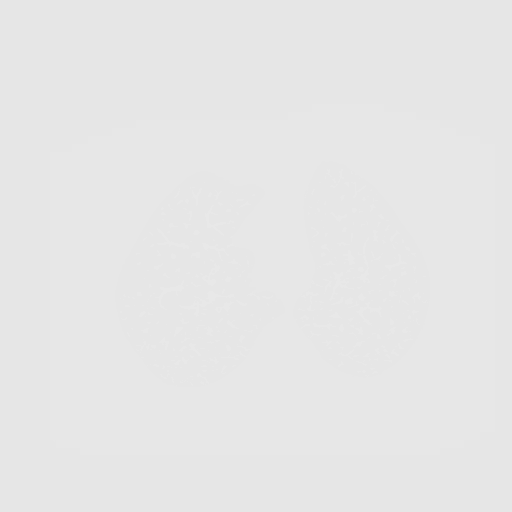
[frame 266/300  mediastinal]
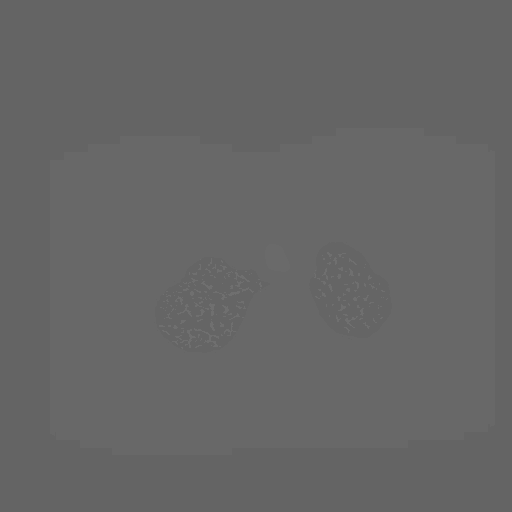
[frame 266/300  lung]
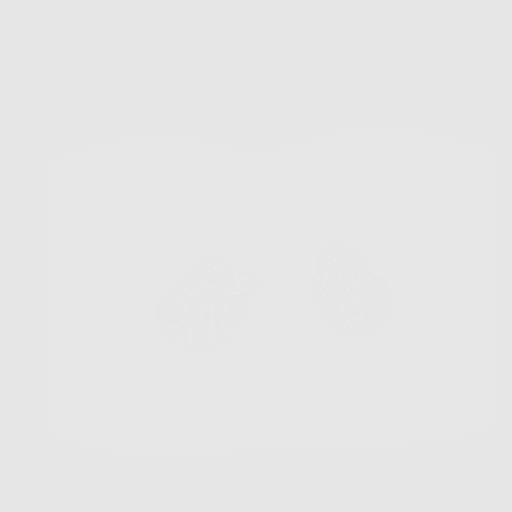
[frame 300/300  lung]
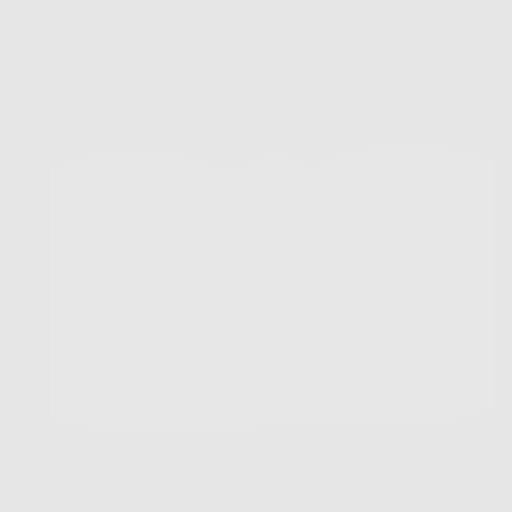

[10 of 10 positions shown; findings below may reference images not displayed]

FINDINGS: Cardiovascular: Coronary artery calcification. Heart size normal. No
pericardial effusion.

Mediastinum/Nodes: No pathologically enlarged mediastinal or
axillary lymph nodes. Hilar regions are difficult to definitively
evaluate without IV contrast. Esophagus is grossly unremarkable.

Lungs/Pleura: Smoking related respiratory bronchiolitis. Mild
centrilobular emphysema. No suspicious pulmonary nodules. No pleural
fluid. Airway is unremarkable.

Upper Abdomen: Liver is decreased in attenuation diffusely. Stones
in the gallbladder. Visualized portions of the adrenal glands, left
kidney, spleen, pancreas, stomach and bowel are unremarkable.

Musculoskeletal: Mild degenerative changes in the spine. No
worrisome lytic or sclerotic lesions.
IMPRESSION: 1. Lung-RADS 1, negative. Continue annual screening with low-dose
chest CT without contrast in 12 months.
2. Hepatic steatosis.
3. Cholelithiasis.
4. Coronary artery calcification.
5.  Emphysema ([id number]).

## 2022-07-19 ENCOUNTER — Telehealth: Payer: Self-pay | Admitting: Gastroenterology

## 2022-07-19 NOTE — Telephone Encounter (Signed)
Good Morning Dr. Chales Abrahams,    Patient called stating that he needed to reschedule his colonoscopy for tomorrow morning at 8:00 due to testing positive for COVID this morning.      Patient was rescheduled for 1/26 at 8:30.

## 2022-07-20 ENCOUNTER — Encounter: Payer: 59 | Admitting: Gastroenterology

## 2022-07-20 ENCOUNTER — Ambulatory Visit: Payer: 59 | Admitting: Family Medicine

## 2022-07-21 NOTE — Telephone Encounter (Signed)
Thanks for letting me know.  Can you please set her up for colonoscopy in Jan 2024 RG

## 2022-07-22 ENCOUNTER — Ambulatory Visit: Payer: 59 | Admitting: Family Medicine

## 2022-09-11 ENCOUNTER — Encounter: Payer: Self-pay | Admitting: Certified Registered Nurse Anesthetist

## 2022-09-16 ENCOUNTER — Telehealth: Payer: Self-pay

## 2022-09-16 ENCOUNTER — Encounter: Payer: 59 | Admitting: Gastroenterology

## 2022-09-16 NOTE — Telephone Encounter (Signed)
Thanks for letting me know RG 

## 2022-09-16 NOTE — Telephone Encounter (Signed)
Called to reschedule patient for his colonoscopy. Patient wants to check his schedule and call back Monday morning to reschedule. Dr. Lyndel Safe wants this patient to have a 2-day prep for his next colonoscopy. He will also need another previsit before procedure.

## 2022-09-16 NOTE — Telephone Encounter (Signed)
Pt called the on-call MD. Stated that he completed the prep and only had one solid BM. On-Call MD advised pt that he would need to be rescheduled. Will consult with his MD, Dr. Lyndel Safe to adjust his prep.

## 2022-09-26 ENCOUNTER — Encounter: Payer: Self-pay | Admitting: Family Medicine

## 2022-09-26 ENCOUNTER — Other Ambulatory Visit: Payer: Self-pay | Admitting: Family Medicine

## 2022-09-26 ENCOUNTER — Ambulatory Visit: Payer: 59 | Admitting: Family Medicine

## 2022-09-26 VITALS — BP 122/80 | HR 68 | Temp 98.2°F | Ht 72.0 in | Wt 249.2 lb

## 2022-09-26 DIAGNOSIS — E1165 Type 2 diabetes mellitus with hyperglycemia: Secondary | ICD-10-CM | POA: Insufficient documentation

## 2022-09-26 DIAGNOSIS — Z720 Tobacco use: Secondary | ICD-10-CM | POA: Diagnosis not present

## 2022-09-26 DIAGNOSIS — E7800 Pure hypercholesterolemia, unspecified: Secondary | ICD-10-CM | POA: Diagnosis not present

## 2022-09-26 DIAGNOSIS — Z1211 Encounter for screening for malignant neoplasm of colon: Secondary | ICD-10-CM

## 2022-09-26 LAB — COMPREHENSIVE METABOLIC PANEL
ALT: 40 U/L (ref 0–53)
AST: 23 U/L (ref 0–37)
Albumin: 4.4 g/dL (ref 3.5–5.2)
Alkaline Phosphatase: 42 U/L (ref 39–117)
BUN: 18 mg/dL (ref 6–23)
CO2: 28 mEq/L (ref 19–32)
Calcium: 9.8 mg/dL (ref 8.4–10.5)
Chloride: 101 mEq/L (ref 96–112)
Creatinine, Ser: 0.89 mg/dL (ref 0.40–1.50)
GFR: 93.6 mL/min (ref >60.00)
Glucose, Bld: 166 mg/dL — ABNORMAL HIGH (ref 70–99)
Potassium: 4.5 mEq/L (ref 3.5–5.1)
Sodium: 138 mEq/L (ref 135–145)
Total Bilirubin: 0.4 mg/dL (ref 0.2–1.2)
Total Protein: 7.4 g/dL (ref 6.0–8.3)

## 2022-09-26 LAB — LIPID PANEL
Cholesterol: 96 mg/dL (ref 0–200)
HDL: 40.1 mg/dL (ref >39.00)
LDL Cholesterol: 37 mg/dL (ref 0–99)
NonHDL: 55.91
Total CHOL/HDL Ratio: 2
Triglycerides: 95 mg/dL (ref 0.0–149.0)
VLDL: 19 mg/dL (ref 0.0–40.0)

## 2022-09-26 LAB — HEMOGLOBIN A1C: Hgb A1c MFr Bld: 7 % — ABNORMAL HIGH (ref 4.6–6.5)

## 2022-09-26 MED ORDER — EMPAGLIFLOZIN 10 MG PO TABS: 10.0000 mg | ORAL_TABLET | Freq: Every day | ORAL | 2 refills | Status: DC

## 2022-09-26 NOTE — Progress Notes (Signed)
Subjective:   Chief Complaint  Patient presents with   Follow-up    Steven Brooks is a 62 y.o. male here for follow-up of diabetes.   Steven Brooks does not routinely check his sugars at home.  Patient does not require insulin.   Medications include: Jardiance 10 mg/d, metformin 1000 mg bid Diet is fair.  Exercise: walking, some lifting wts  Hyperlipidemia Patient presents for dyslipidemia follow up. Currently being treated with Crestor 20 mg/d and compliance with treatment thus far has been good. He denies myalgias. Diet/exercise as above.  The patient is not known to have coexisting coronary artery disease. No Cp or SOB.   Past Medical History:  Diagnosis Date   Allergy    Asthma    Hyperlipidemia    Type 2 diabetes mellitus with hyperglycemia (HCC)      Related testing: Retinal exam: Done Pneumovax: done  Objective:  BP 122/80 (BP Location: Left Arm, Patient Position: Sitting, Cuff Size: Normal)   Pulse 68   Temp 98.2 F (36.8 C) (Oral)   Ht 6' (1.829 m)   Wt 249 lb 4 oz (113.1 kg)   SpO2 94%   BMI 33.80 kg/m  General:  Well developed, well nourished, in no apparent distress Skin:  Warm, no pallor or diaphoresis Lungs:  CTAB, no access msc use Cardio:  RRR, no bruits, no LE edema Psych: Age appropriate judgment and insight  Assessment:   Type 2 diabetes mellitus with hyperglycemia, without long-term current use of insulin (HCC) - Plan: Comprehensive metabolic panel, Lipid panel, Hemoglobin A1c  Pure hypercholesterolemia  Screen for colon cancer - Plan: Cologuard  Tobacco abuse - Plan: Ambulatory Referral Lung Cancer Screening [facility]   Plan:   Chronic, unsure if stable.  Continue metformin 1000 mg twice daily, Jardiance 10 mg daily.  Could increase that versus adding a new medication depending on control.  He would prefer to avoid a shot.  Counseled on diet and exercise. Chronic, stable.  Continue Crestor 20 mg daily. He had a very difficult time  with the colonoscopy prep and is wondering if there is anything else.  We will set up the Cologuard screening. Due for this, refer to lung cancer screening team for our group protocol. F/u in 3-6 mo pending results.. The patient voiced understanding and agreement to the plan.  [place], DO 09/26/22 10:11 AM

## 2022-09-26 NOTE — Patient Instructions (Signed)
Give [place] 2-3 business days to get the results of your labs back.   Keep the diet clean and stay active.  If you don't receive a kit from the [facility]olog[facility] group in the next couple weeks, let us know.   Let us know if you need anything.

## 2022-10-17 LAB — COLOGUARD

## 2022-11-18 ENCOUNTER — Other Ambulatory Visit: Payer: Self-pay | Admitting: Family Medicine

## 2022-11-18 DIAGNOSIS — R195 Other fecal abnormalities: Secondary | ICD-10-CM

## 2022-11-18 LAB — COLOGUARD: COLOGUARD: POSITIVE — AB

## 2022-11-21 ENCOUNTER — Encounter: Payer: Self-pay | Admitting: Family Medicine

## 2022-11-21 LAB — COLOGUARD: Cologuard: POSITIVE — AB

## 2022-11-22 ENCOUNTER — Encounter: Payer: Self-pay | Admitting: Gastroenterology

## 2022-12-07 ENCOUNTER — Encounter: Payer: Self-pay | Admitting: Family Medicine

## 2022-12-07 DIAGNOSIS — E1165 Type 2 diabetes mellitus with hyperglycemia: Secondary | ICD-10-CM

## 2022-12-08 MED ORDER — ROSUVASTATIN CALCIUM 20 MG PO TABS: 20.0000 mg | ORAL_TABLET | Freq: Every day | ORAL | 3 refills | Status: DC

## 2022-12-08 MED ORDER — EMPAGLIFLOZIN 10 MG PO TABS: 10.0000 mg | ORAL_TABLET | Freq: Every day | ORAL | 2 refills | Status: DC

## 2022-12-08 MED ORDER — METFORMIN HCL 500 MG PO TABS: 1000.0000 mg | ORAL_TABLET | Freq: Two times a day (BID) | ORAL | 2 refills | Status: DC

## 2022-12-13 ENCOUNTER — Telehealth: Payer: Self-pay

## 2022-12-13 ENCOUNTER — Ambulatory Visit (AMBULATORY_SURGERY_CENTER): Payer: 59

## 2022-12-13 VITALS — Ht 72.0 in | Wt 238.0 lb

## 2022-12-13 DIAGNOSIS — R195 Other fecal abnormalities: Secondary | ICD-10-CM

## 2022-12-13 MED ORDER — PEG 3350-KCL-NA BICARB-NACL 420 G PO SOLR: 4000.0000 mL | Freq: Once | ORAL | 0 refills | Status: AC

## 2022-12-13 NOTE — Telephone Encounter (Signed)
Pt reached.

## 2022-12-13 NOTE — Progress Notes (Signed)
No egg or soy allergy known to patient  No issues known to pt with past sedation with any surgeries or procedures Patient denies ever being told they had issues or difficulty with intubation  No FH of Malignant Hyperthermia Pt is not on diet pills Pt is not on  home 02  Pt is not on blood thinners  Pt denies issues with constipation  No A fib or A flutter Have any cardiac testing pending--no Pt with full movement and mobility  Pt instructed to use [email or web address] or GoodRx for a price reduction on prep

## 2023-01-03 ENCOUNTER — Encounter: Payer: Self-pay | Admitting: Gastroenterology

## 2023-01-10 ENCOUNTER — Encounter: Payer: Self-pay | Admitting: Family Medicine

## 2023-01-10 DIAGNOSIS — E1165 Type 2 diabetes mellitus with hyperglycemia: Secondary | ICD-10-CM

## 2023-01-10 MED ORDER — ROSUVASTATIN CALCIUM 20 MG PO TABS: 20.0000 mg | ORAL_TABLET | Freq: Every day | ORAL | 1 refills | Status: DC

## 2023-01-10 MED ORDER — EMPAGLIFLOZIN 10 MG PO TABS: 10.0000 mg | ORAL_TABLET | Freq: Every day | ORAL | 1 refills | Status: AC

## 2023-01-10 MED ORDER — TRIAMCINOLONE ACETONIDE 0.1 % EX CREA: TOPICAL_CREAM | Freq: Two times a day (BID) | CUTANEOUS | 3 refills | Status: AC

## 2023-01-10 MED ORDER — METFORMIN HCL 500 MG PO TABS: 1000.0000 mg | ORAL_TABLET | Freq: Two times a day (BID) | ORAL | 1 refills | Status: DC

## 2023-01-20 ENCOUNTER — Encounter: Payer: 59 | Admitting: Gastroenterology

## 2023-03-29 ENCOUNTER — Encounter: Payer: 59 | Admitting: Family Medicine

## 2023-07-30 ENCOUNTER — Other Ambulatory Visit: Payer: Self-pay | Admitting: Family Medicine

## 2023-07-30 DIAGNOSIS — E1165 Type 2 diabetes mellitus with hyperglycemia: Secondary | ICD-10-CM

## 8387-04-23 DEATH — deceased
